# Patient Record
Sex: Female | Born: 1970 | Race: Black or African American | Hispanic: No | Marital: Single | Smoking: Never smoker
Health system: Southern US, Community
[De-identification: ages and names within clinical notes are randomized; demographics above are authoritative.]

## PROBLEM LIST (undated history)

## (undated) DIAGNOSIS — I10 Essential (primary) hypertension: Secondary | ICD-10-CM

## (undated) HISTORY — PX: ABDOMINAL HYSTERECTOMY: SHX81

## (undated) HISTORY — PX: BREAST SURGERY: SHX581

---

## 2019-07-01 ENCOUNTER — Encounter (HOSPITAL_COMMUNITY): Payer: Self-pay | Admitting: Emergency Medicine

## 2019-07-01 ENCOUNTER — Other Ambulatory Visit: Payer: Self-pay

## 2019-07-01 ENCOUNTER — Emergency Department (HOSPITAL_COMMUNITY)
Admission: EM | Admit: 2019-07-01 | Discharge: 2019-07-01 | Disposition: A | Discharge status: Home / Self Care | Payer: Self-pay | Attending: Emergency Medicine | Admitting: Emergency Medicine

## 2019-07-01 DIAGNOSIS — R519 Headache, unspecified: Secondary | ICD-10-CM | POA: Insufficient documentation

## 2019-07-01 DIAGNOSIS — Z5321 Procedure and treatment not carried out due to patient leaving prior to being seen by health care provider: Secondary | ICD-10-CM | POA: Insufficient documentation

## 2019-07-01 DIAGNOSIS — I10 Essential (primary) hypertension: Secondary | ICD-10-CM | POA: Insufficient documentation

## 2019-07-01 HISTORY — DX: Essential (primary) hypertension: I10

## 2019-07-01 NOTE — ED Triage Notes (Signed)
Pt reports since yesterday having headache and not feeling well. Reports took her BP this morning and was 179/106 ad PCP advised to go to ED. Reports Hx HTN and takes her medication as prescribed.

## 2019-07-15 ENCOUNTER — Emergency Department (HOSPITAL_COMMUNITY): Payer: Self-pay

## 2019-07-15 ENCOUNTER — Encounter (HOSPITAL_COMMUNITY): Payer: Self-pay

## 2019-07-15 ENCOUNTER — Other Ambulatory Visit: Payer: Self-pay

## 2019-07-15 ENCOUNTER — Emergency Department (HOSPITAL_COMMUNITY)
Admission: EM | Admit: 2019-07-15 | Discharge: 2019-07-15 | Disposition: A | Discharge status: Home / Self Care | Payer: Self-pay | Attending: Emergency Medicine | Admitting: Emergency Medicine

## 2019-07-15 DIAGNOSIS — Z7901 Long term (current) use of anticoagulants: Secondary | ICD-10-CM | POA: Insufficient documentation

## 2019-07-15 DIAGNOSIS — Z79899 Other long term (current) drug therapy: Secondary | ICD-10-CM | POA: Insufficient documentation

## 2019-07-15 DIAGNOSIS — R911 Solitary pulmonary nodule: Secondary | ICD-10-CM

## 2019-07-15 DIAGNOSIS — I10 Essential (primary) hypertension: Secondary | ICD-10-CM | POA: Insufficient documentation

## 2019-07-15 DIAGNOSIS — I2693 Single subsegmental pulmonary embolism without acute cor pulmonale: Secondary | ICD-10-CM | POA: Insufficient documentation

## 2019-07-15 LAB — BASIC METABOLIC PANEL
Anion gap: 10 (ref 5–15)
BUN: 14 mg/dL (ref 6–20)
CO2: 25 mmol/L (ref 22–32)
Calcium: 8.4 mg/dL — ABNORMAL LOW (ref 8.9–10.3)
Chloride: 102 mmol/L (ref 98–111)
Creatinine, Ser: 0.97 mg/dL (ref 0.44–1.00)
GFR calc Af Amer: >60 mL/min (ref >60)
GFR calc non Af Amer: >60 mL/min (ref >60)
Glucose, Bld: 94 mg/dL (ref 70–99)
Potassium: 4.5 mmol/L (ref 3.5–5.1)
Sodium: 137 mmol/L (ref 135–145)

## 2019-07-15 LAB — TROPONIN I (HIGH SENSITIVITY)
Troponin I (High Sensitivity): 2 ng/L (ref <18)
Troponin I (High Sensitivity): 3 ng/L (ref <18)

## 2019-07-15 LAB — I-STAT BETA HCG BLOOD, ED (MC, WL, AP ONLY): I-stat hCG, quantitative: <5 m[IU]/mL (ref <5)

## 2019-07-15 LAB — CBC
HCT: 38.8 % (ref 36.0–46.0)
Hemoglobin: 12.7 g/dL (ref 12.0–15.0)
MCH: 28.9 pg (ref 26.0–34.0)
MCHC: 32.7 g/dL (ref 30.0–36.0)
MCV: 88.4 fL (ref 80.0–100.0)
Platelets: 172 10*3/uL (ref 150–400)
RBC: 4.39 MIL/uL (ref 3.87–5.11)
RDW: 12.7 % (ref 11.5–15.5)
WBC: 12.1 10*3/uL — ABNORMAL HIGH (ref 4.0–10.5)
nRBC: 0 % (ref 0.0–0.2)

## 2019-07-15 LAB — D-DIMER, QUANTITATIVE: D-Dimer, Quant: 0.52 ug/mL-FEU — ABNORMAL HIGH (ref 0.00–0.50)

## 2019-07-15 IMAGING — CT CT ANGIO CHEST
2 of 6 series · 18 of 36 positions shown · IV contrast (omnipaque)
Comparison: None.

CLINICAL DATA: Left-sided chest pain radiating into the left arm
for 2 days with shortness of breath, initial encounter

EXAM:
CT ANGIOGRAPHY CHEST WITH CONTRAST
TECHNIQUE: Multidetector CT imaging of the chest was performed using the
standard protocol during bolus administration of intravenous
contrast. Multiplanar CT image reconstructions and MIPs were
obtained to evaluate the vascular anatomy.
CONTRAST:  100mL OMNIPAQUE IOHEXOL 350 MG/ML SOLN

[Series 5: thins · axial · 0.62mm/px · z∈[+1532,+1740]mm · 17 of 233 slices shown]
[im 13/233  lung]
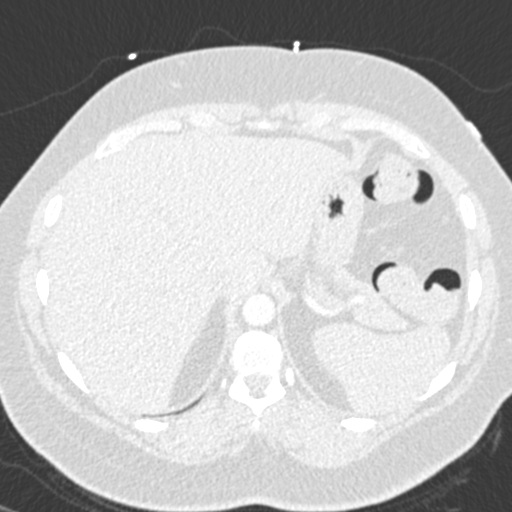
[im 26/233  mediastinal]
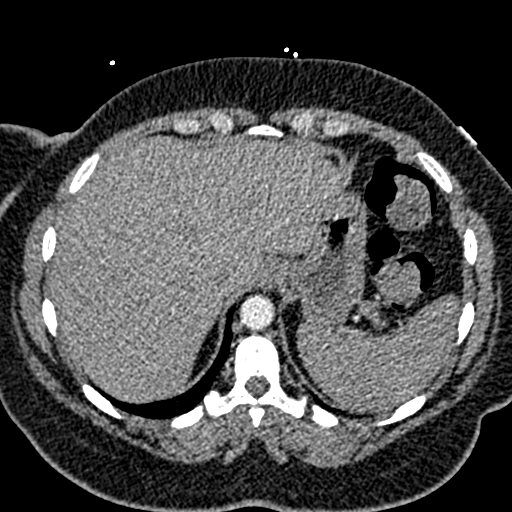
[im 39/233  lung]
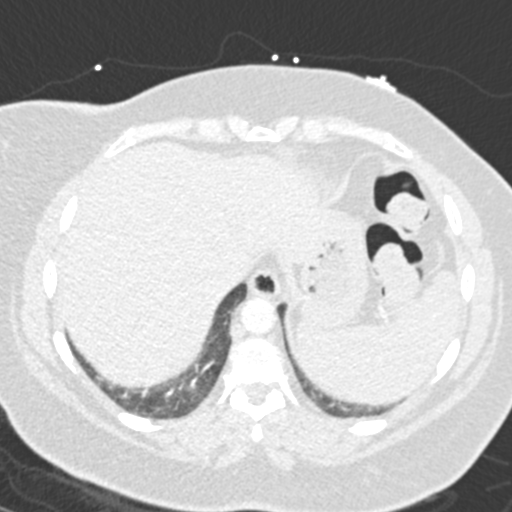
[im 52/233  mediastinal]
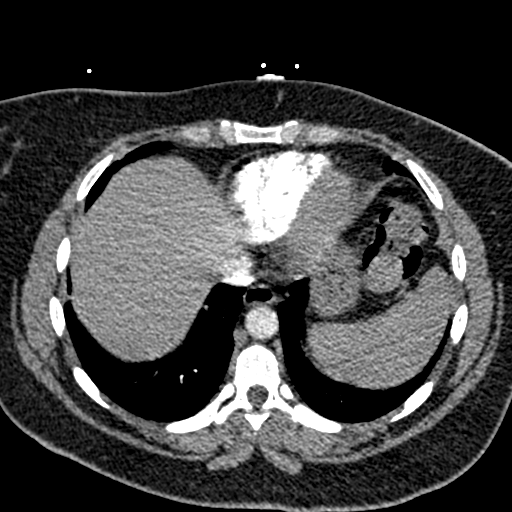
[im 65/233  lung]
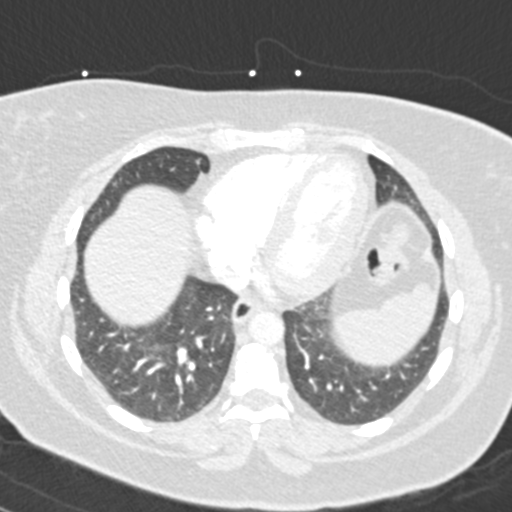
[im 78/233  mediastinal]
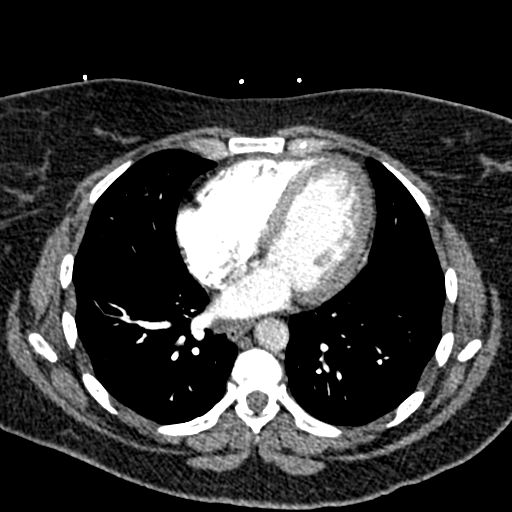
[im 91/233  lung]
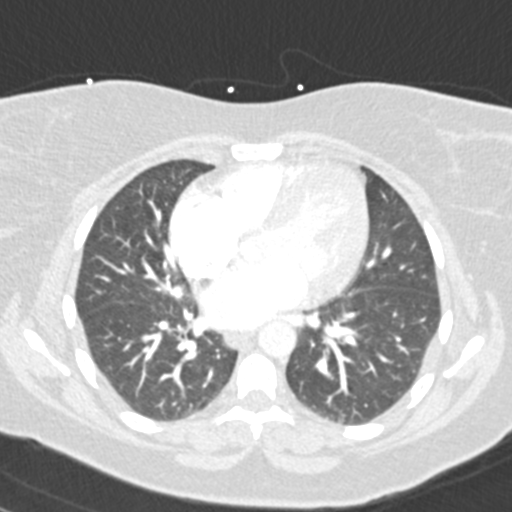
[im 104/233  mediastinal]
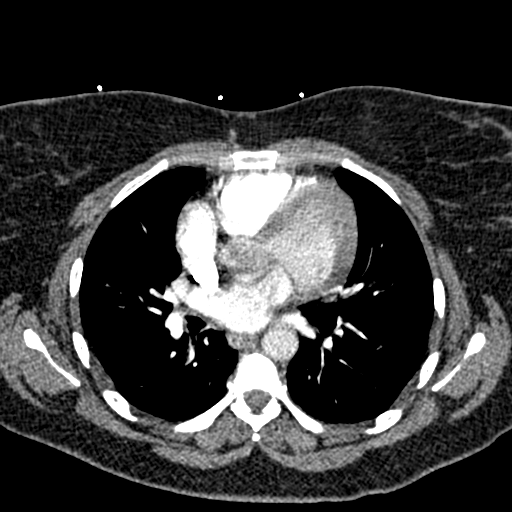
[im 117/233  lung]
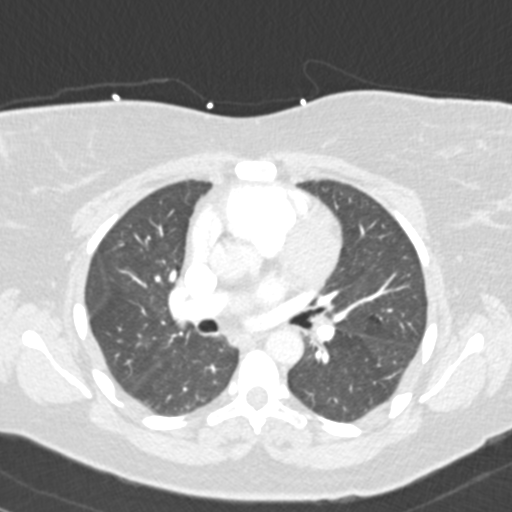
[im 129/233  mediastinal]
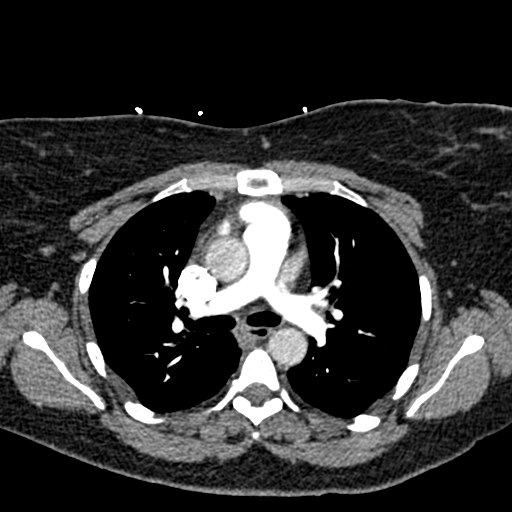
[im 142/233  lung]
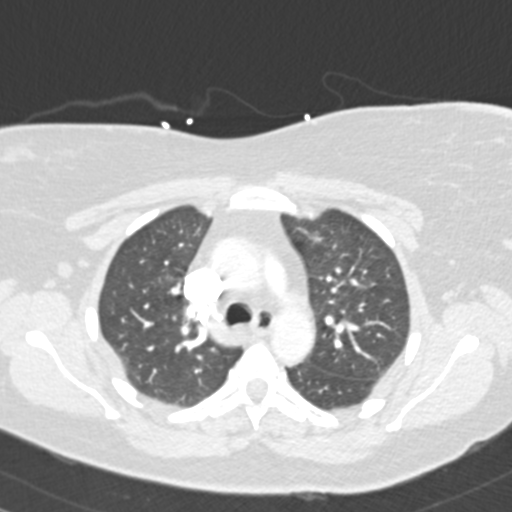
[im 155/233  mediastinal]
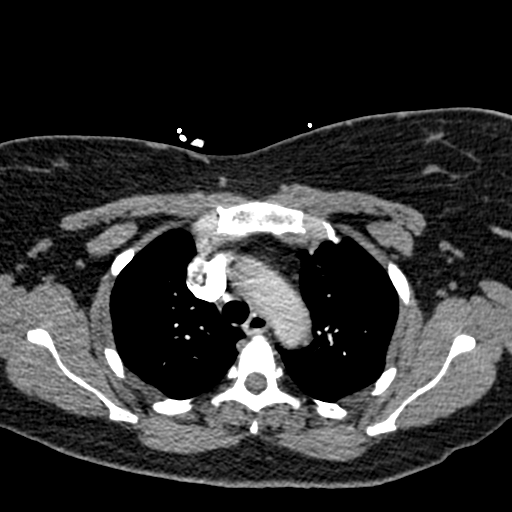
[im 168/233  lung]
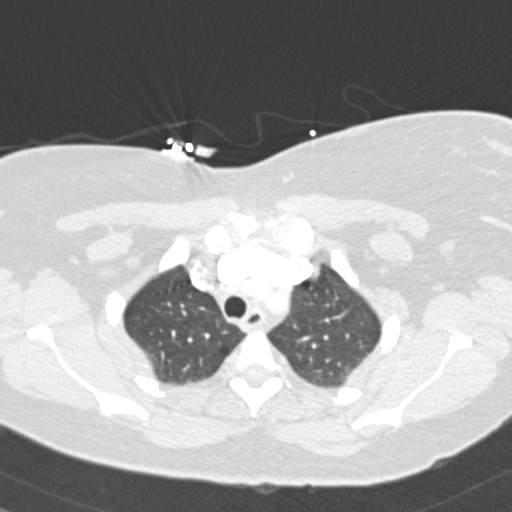
[im 181/233  mediastinal]
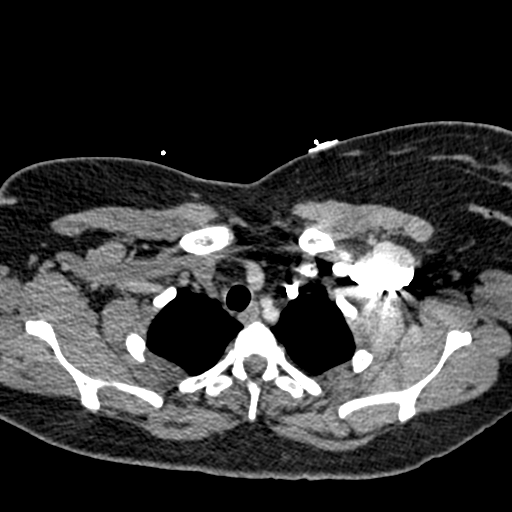
[im 194/233  lung]
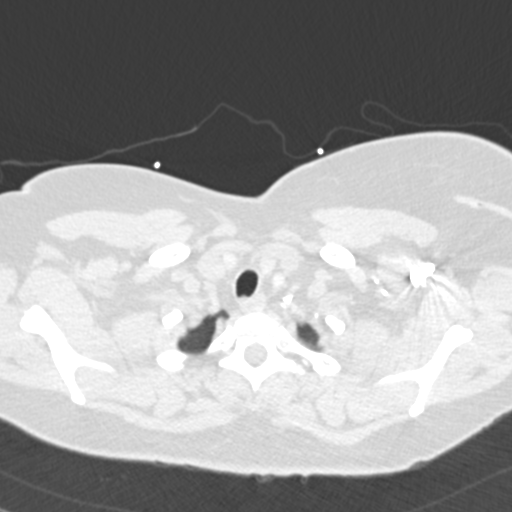
[im 207/233  mediastinal]
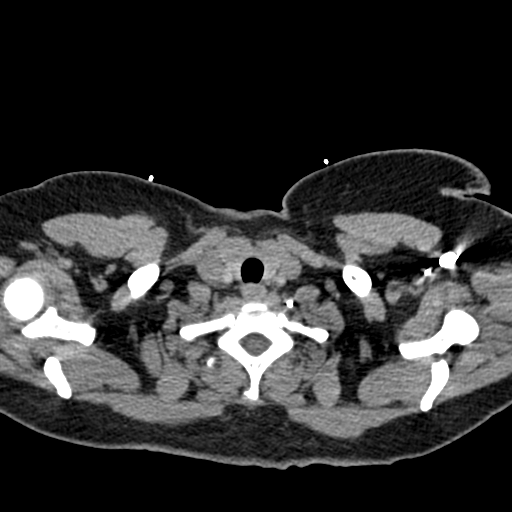
[im 220/233  lung]
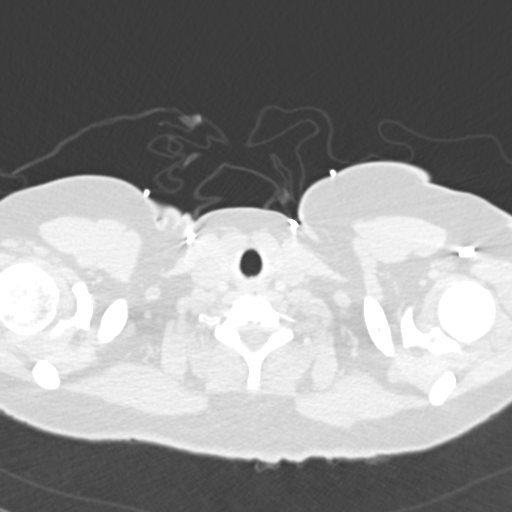

[Series 6: coronal mpr · coronal · 0.47mm/px · 1 of 138 slices shown]
[im 69/138  mediastinal]
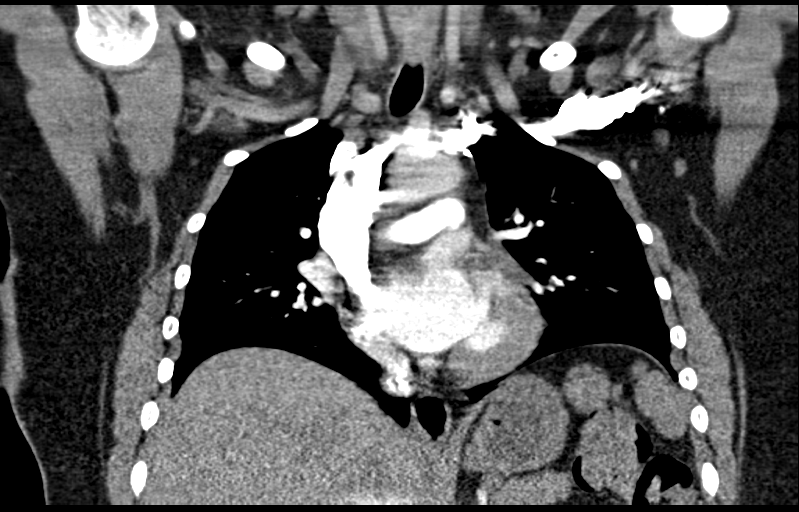

[18 of 36 positions shown; findings below may reference images not displayed]

FINDINGS: Cardiovascular: Thoracic aorta is within normal limits. No cardiac
enlargement is seen. The pulmonary artery is well visualized within
normal branching pattern. Small left lower lobe pulmonary emboli are
noted. No findings to suggest right heart strain are seen. No
significant coronary calcifications are noted.

Mediastinum/Nodes: Thoracic inlet is within normal limits. No
sizable hilar or mediastinal adenopathy is noted. The esophagus is
within normal limits.

Lungs/Pleura: Lungs are well aerated bilaterally. Small 4 mm nodule
is noted in the medial aspect of the right lower lobe. Mild
emphysematous changes are seen. No focal infiltrate or sizable
effusion is noted.

Upper Abdomen: Visualized upper abdomen shows no acute abnormality.

Musculoskeletal: No chest wall abnormality. No acute or significant
osseous findings.

Review of the MIP images confirms the above findings.
IMPRESSION: Small pulmonary emboli within the left lower lobe. No right heart
strain is noted.

Small 4 mm nodule in the medial right lower lobe. No follow-up
needed if patient is low-risk. Non-contrast chest CT can be
considered in 12 months if patient is high-risk. This recommendation
follows the consensus statement: Guidelines for Management of
Incidental Pulmonary Nodules Detected on CT Images: From the

Emphysema ([Y0]-[Y0]).

## 2019-07-15 IMAGING — CR DG CHEST 2V
2 series · 2 of 2 positions shown · non-contrast
Comparison: None.

CLINICAL DATA: Chest pain and shortness of breath two days.

EXAM:
CHEST - 2 VIEW

[w chest pa]
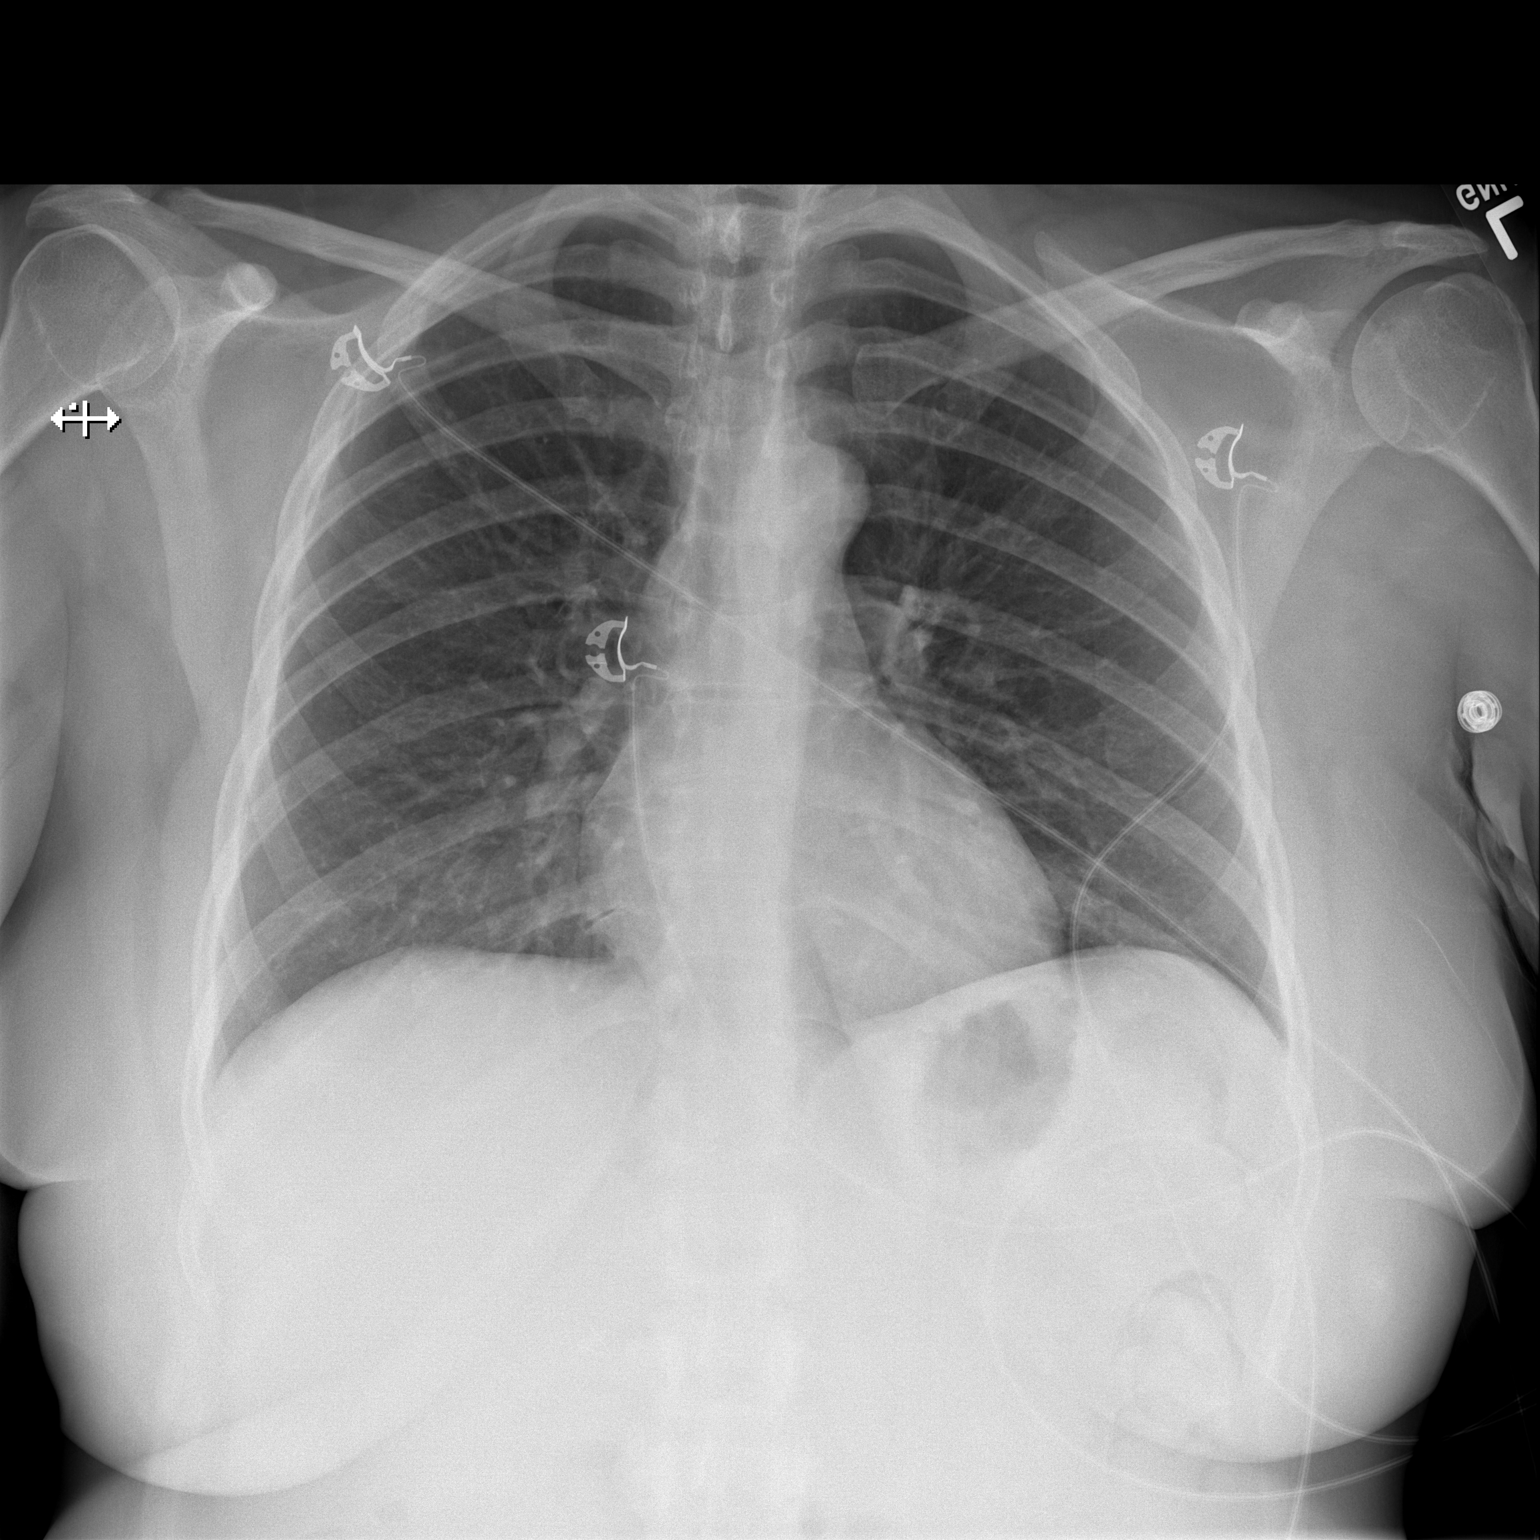

[w chest lat]
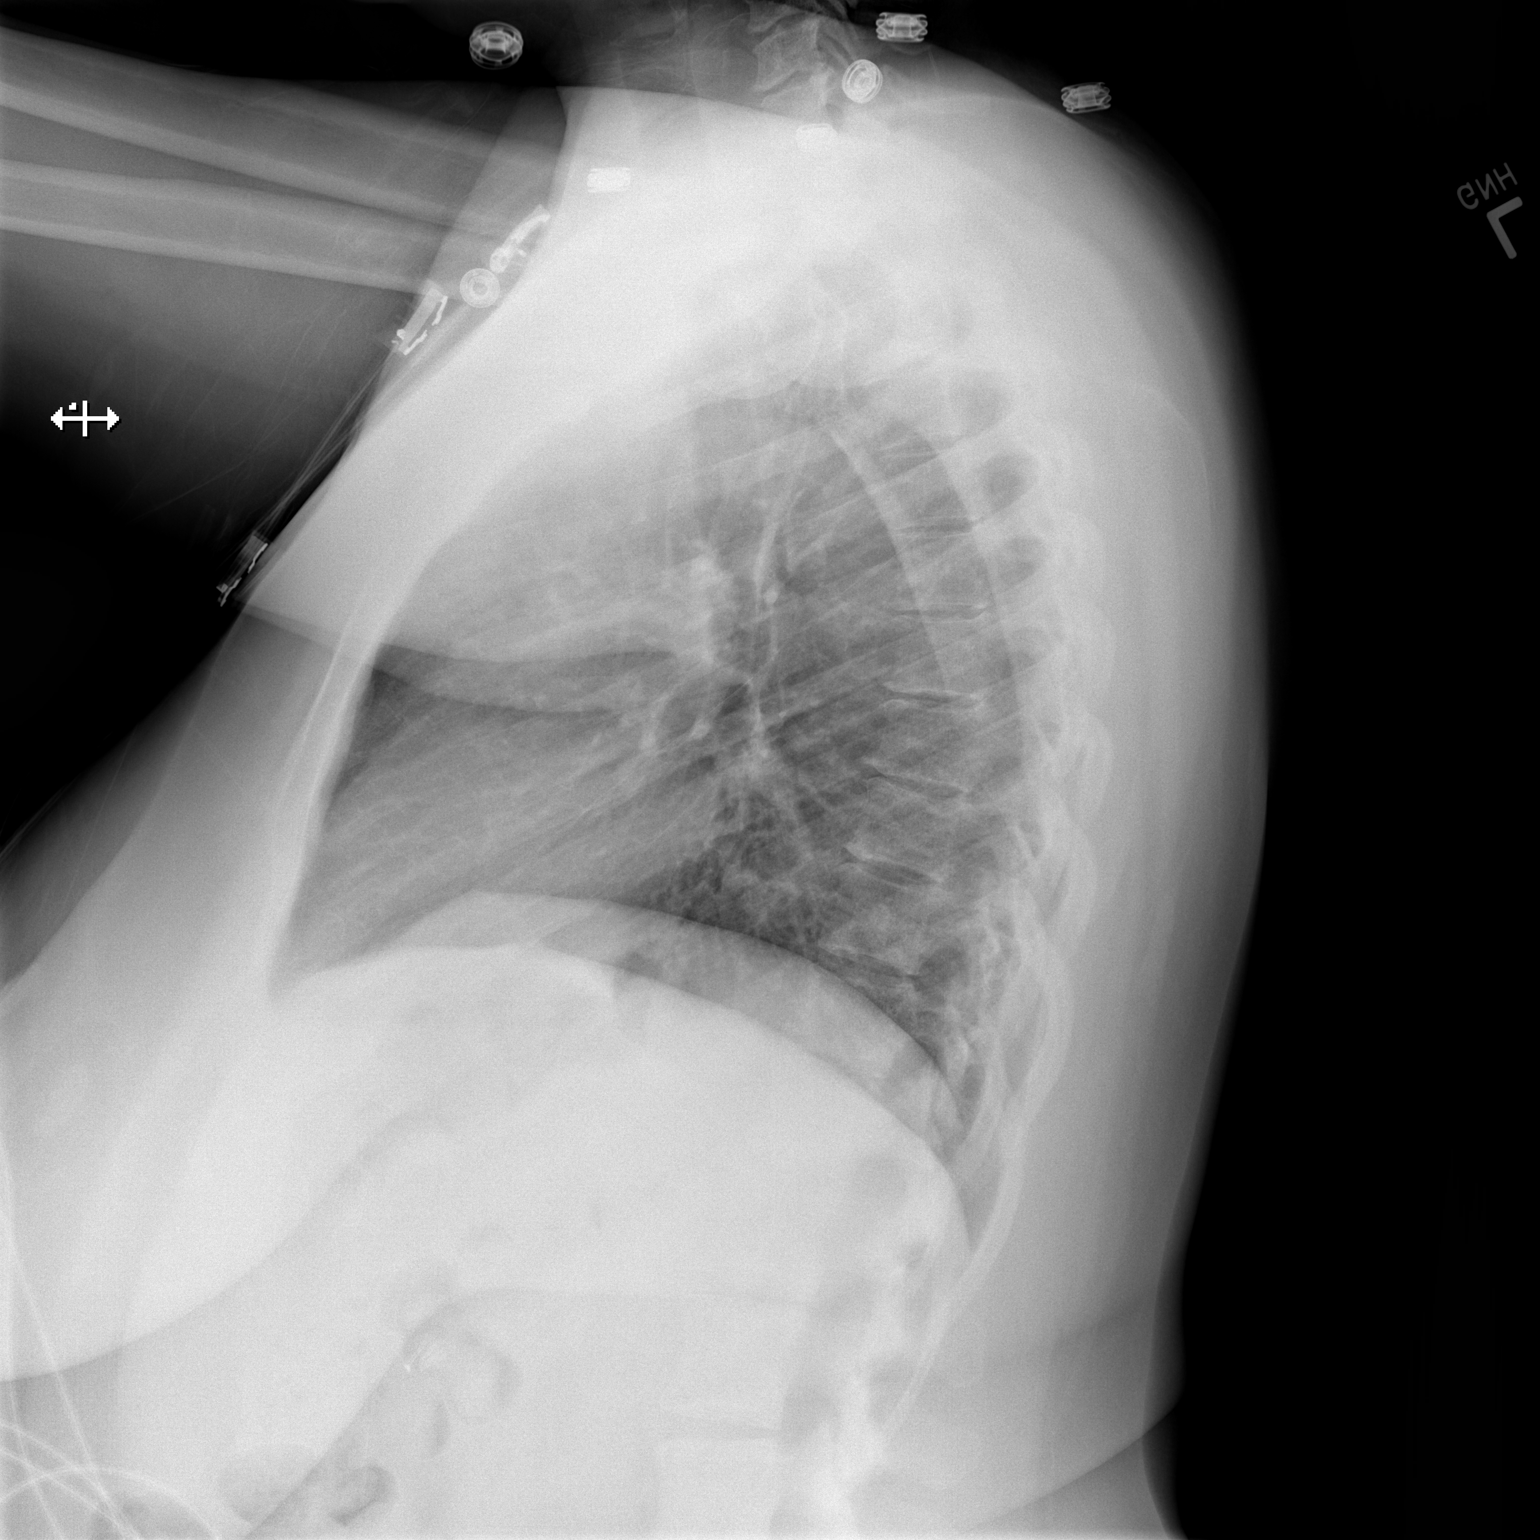

[2 of 2 positions shown; findings below may reference images not displayed]

FINDINGS: Lungs are clear. Cardiomediastinal silhouette and remainder of the
exam is unremarkable.
IMPRESSION: No active cardiopulmonary disease.

## 2019-07-15 MED ORDER — RIVAROXABAN 15 MG PO TABS: 15.0000 mg | ORAL_TABLET | Freq: Two times a day (BID) | ORAL | Status: DC

## 2019-07-15 MED ORDER — IOHEXOL 350 MG/ML SOLN
100.0000 mL | Freq: Once | INTRAVENOUS | Status: AC | PRN
  Administered 2019-07-15: 100 mL via INTRAVENOUS

## 2019-07-15 MED ORDER — NITROGLYCERIN 0.4 MG SL SUBL
0.4000 mg | SUBLINGUAL_TABLET | Freq: Once | SUBLINGUAL | Status: AC
  Administered 2019-07-15 (×2): 0.4 mg via SUBLINGUAL
  Filled 2019-07-15: qty 1

## 2019-07-15 MED ORDER — ASPIRIN 81 MG PO CHEW
324.0000 mg | CHEWABLE_TABLET | Freq: Once | ORAL | Status: AC
  Administered 2019-07-15: 324 mg via ORAL
  Filled 2019-07-15: qty 4

## 2019-07-15 MED ORDER — RIVAROXABAN 15 MG PO TABS
15.0000 mg | ORAL_TABLET | Freq: Once | ORAL | Status: AC
  Administered 2019-07-15: 15 mg via ORAL
  Filled 2019-07-15: qty 1

## 2019-07-15 MED ORDER — RIVAROXABAN 20 MG PO TABS: 20.0000 mg | ORAL_TABLET | Freq: Every day | ORAL | Status: DC

## 2019-07-15 MED ORDER — RIVAROXABAN (XARELTO) VTE STARTER PACK (15 & 20 MG)
ORAL_TABLET | ORAL | 0 refills | Status: AC
Start: 2019-07-15 — End: ?

## 2019-07-15 MED ORDER — MORPHINE SULFATE (PF) 4 MG/ML IV SOLN
4.0000 mg | Freq: Once | INTRAVENOUS | Status: AC
  Administered 2019-07-15: 4 mg via INTRAVENOUS
  Filled 2019-07-15: qty 1

## 2019-07-15 MED ORDER — FENTANYL CITRATE (PF) 100 MCG/2ML IJ SOLN
50.0000 ug | Freq: Once | INTRAMUSCULAR | Status: AC
  Administered 2019-07-15: 50 ug via INTRAVENOUS
  Filled 2019-07-15: qty 2

## 2019-07-15 MED ORDER — RIVAROXABAN (XARELTO) EDUCATION KIT FOR DVT/PE PATIENTS
PACK | Freq: Once | Status: AC
  Filled 2019-07-15: qty 1

## 2019-07-15 MED ORDER — PANTOPRAZOLE SODIUM 40 MG IV SOLR
40.0000 mg | Freq: Once | INTRAVENOUS | Status: AC
  Administered 2019-07-15: 40 mg via INTRAVENOUS
  Filled 2019-07-15: qty 40

## 2019-07-15 MED ORDER — SODIUM CHLORIDE 0.9% FLUSH
3.0000 mL | Freq: Once | INTRAVENOUS | Status: AC
  Administered 2019-07-15: 3 mL via INTRAVENOUS

## 2019-07-15 MED ORDER — SODIUM CHLORIDE 0.9 % IV BOLUS
500.0000 mL | Freq: Once | INTRAVENOUS | Status: AC
  Administered 2019-07-15: 500 mL via INTRAVENOUS

## 2019-07-15 MED ORDER — SODIUM CHLORIDE (PF) 0.9 % IJ SOLN
INTRAMUSCULAR | Status: AC
  Filled 2019-07-15: qty 50

## 2019-07-15 NOTE — Progress Notes (Addendum)
Xarelto per Pharmacy  44 yoF with new Dx PE; pharmacy consulted to dose Xarelto for ED discharge   Appropriate dose and discharge orders already entered by MD  Pharmacist provided patient education and 30-day coupon  Bernadene Person, PharmD, BCPS [phone] 07/15/2019, 6:56 PM

## 2019-07-15 NOTE — ED Notes (Signed)
Pt ambulated to restroom without assistance. Pt stable on her feet.

## 2019-07-15 NOTE — ED Notes (Signed)
Patient transported to XR at this time.

## 2019-07-15 NOTE — Discharge Instructions (Addendum)
At this time there does not appear to be the presence of an emergent medical condition, however there is always the potential for conditions to change. Please read and follow the below instructions.  1. Please return to the Emergency Department immediately for any new or worsening symptoms. 2. Please be sure to follow up with your Primary Care Provider within one week regarding your visit today; please call their office to schedule an appointment even if you are feeling better for a follow-up visit. 3. Please follow-up with your primary care doctor next week for a reassessment.  Your CT scan today showed a small pulmonary embolism in your left lower lobe.  You have been started on a blood thinner called Xarelto for treatment of this.  Xarelto which is a blood thinner puts you at high risk for bleeding so if you have any injuries such as hitting your head you must immediately return to the ER for reevaluation.  Additionally your CT scan showed a pulmonary nodule in your right lower lobe, please discuss this with your primary care doctor at your follow-up visit.  You will need a repeat CT scan of your chest for further evaluation of the pulmonary nodule in 4-months.  Get help right away if: 1. You have: ? New or increased pain, swelling, warmth, or redness in an arm or leg. ? Numbness or tingling in an arm or leg. ? Shortness of breath during activity or at rest. ? A fever. ? Chest pain. ? A rapid or irregular heartbeat. ? A severe headache. ? Vision changes. ? A serious fall or accident, or you hit your head. ? Stomach (abdominal) pain. ? Blood in your vomit, stool, or urine. ? A cut that will not stop bleeding. 2. You cough up blood. 3. You feel light-headed or dizzy. 4. You cannot move your arms or legs. 5. You are confused or have memory loss. 6. You have any new/concerning or worsening of symptoms  Please read the additional information packets attached to your discharge summary.  Do  not take your medicine if  develop an itchy rash, swelling in your mouth or lips, or difficulty breathing; call 911 and seek immediate emergency medical attention if this occurs.  Note: Portions of this text may have been transcribed using voice recognition software. Every effort was made to ensure accuracy; however, inadvertent computerized transcription errors may still be present.  Information on my medicine - XARELTO (rivaroxaban)  This medication education was reviewed with me or my healthcare representative as part of my discharge preparation.  The pharmacist that spoke with me during my hospital stay was:  Ethylene Reznick A, RPH  WHY WAS XARELTO PRESCRIBED FOR YOU? Xarelto was prescribed to treat blood clots that may have been found in the veins of your legs (deep vein thrombosis) or in your lungs (pulmonary embolism) and to reduce the risk of them occurring again.  What do you need to know about Xarelto? The starting dose is one 15 mg tablet taken TWICE daily with food for the FIRST 21 DAYS then on (enter date)  08/06/19  the dose is changed to one 20 mg tablet taken ONCE A DAY with your evening meal.  DO NOT stop taking Xarelto without talking to the health care provider who prescribed the medication.  Refill your prescription for 20 mg tablets before you run out.  After discharge, you should have regular check-up appointments with your healthcare provider that is prescribing your Xarelto.  In the future your dose  may need to be changed if your kidney function changes by a significant amount.  What do you do if you miss a dose? If you are taking Xarelto TWICE DAILY and you miss a dose, take it as soon as you remember. You may take two 15 mg tablets (total 30 mg) at the same time then resume your regularly scheduled 15 mg twice daily the next day.  If you are taking Xarelto ONCE DAILY and you miss a dose, take it as soon as you remember on the same day then continue your regularly  scheduled once daily regimen the next day. Do not take two doses of Xarelto at the same time.   Important Safety Information Xarelto is a blood thinner medicine that can cause bleeding. You should call your healthcare provider right away if you experience any of the following: ? Bleeding from an injury or your nose that does not stop. ? Unusual colored urine (red or dark brown) or unusual colored stools (red or black). ? Unusual bruising for unknown reasons. ? A serious fall or if you hit your head (even if there is no bleeding).  Some medicines may interact with Xarelto and might increase your risk of bleeding while on Xarelto. To help avoid this, consult your healthcare provider or pharmacist prior to using any new prescription or non-prescription medications, including herbals, vitamins, non-steroidal anti-inflammatory drugs (NSAIDs) and supplements.  This website has more information on Xarelto: [email or web address]

## 2019-07-15 NOTE — ED Provider Notes (Signed)
[facility] DEPT Provider Note   CSN: [id number] Arrival date & time: 07/15/19  1115     History Chief Complaint  Patient presents with  . Chest Pain    Kristina Sellers is a 49 y.o. female history of hypertension, hysterectomy.  Patient presents today for severe left chest pain and arm pain onset 2 days ago pain is constant sharp nonradiating worsened with palpation no alleviating factors.  Patient attempted Tylenol yesterday without relief of her symptoms.  She denies similar pain in the past.  Associated with shortness of breath which is only present during worsening of pain.  Patient denies fever/chills, fall/injury, sore throat, cough/hemoptysis, abdominal pain, nausea/vomiting, diarrhea, extremity swelling/color change or any additional concerns. Former smoker.  Patient reports that she received her Covid vaccine earlier this year. HPI     Past Medical History:  Diagnosis Date  . Hypertension     There are no problems to display for this patient.   Past Surgical History:  Procedure Laterality Date  . ABDOMINAL HYSTERECTOMY    . BREAST SURGERY       OB History   No obstetric history on file.     Family History  Adopted: Yes    Social History   Tobacco Use  . Smoking status: Never Smoker  . Smokeless tobacco: Never Used  Vaping Use  . Vaping Use: Never used  Substance Use Topics  . Alcohol use: Never  . Drug use: Never    Home Medications Prior to Admission medications   Medication Sig Start Date End Date Taking? Authorizing Provider  albuterol (VENTOLIN HFA) 108 (90 Base) MCG/ACT inhaler Inhale 2 puffs into the lungs every 6 (six) hours as needed for wheezing or shortness of breath.   Yes [provider]  amLODipine (NORVASC) 10 MG tablet Take 10 mg by mouth daily. 07/05/19  Yes [provider]  hydrochlorothiazide (HYDRODIURIL) 25 MG tablet Take 25 mg by mouth every morning. 07/05/19  Yes [provider]  linaclotide (LINZESS) 290 MCG CAPS capsule Take 290 mcg by mouth daily as needed (dizziness).   Yes [provider]  sodium chloride (OCEAN) 0.65 % SOLN nasal spray Place 1 spray into both nostrils daily as needed for congestion.   Yes [provider]  RIVAROXABAN Alveda Reasons) VTE STARTER PACK (15 & 20 MG TABLETS) Follow package directions: Take one 70m tablet by mouth twice a day. On day 22, switch to one 275mtablet once a day. Take with food. 07/15/19   MoDeliah BostonPA-C    Allergies    Penicillins  Review of Systems   Review of Systems Ten systems are reviewed and are negative for acute change except as noted in the HPI  Physical Exam Updated Vital Signs BP 132/84   Pulse (!) 50   Temp 98.1 F (36.7 C) (Oral)   Resp 14   Ht _0  (1.575 m)   Wt 97.1 kg   SpO2 100%   BMI 39.14 kg/m   Physical Exam Constitutional:      General: She is not in acute distress.    Appearance: Normal appearance. She is well-developed. She is not ill-appearing or diaphoretic.  HENT:     Head: Normocephalic and atraumatic.  Eyes:     General: Vision grossly intact. Gaze aligned appropriately.     Pupils: Pupils are equal, round, and reactive to light.  Neck:     Trachea: Trachea and phonation normal.  Cardiovascular:  Rate and Rhythm: Normal rate and regular rhythm.  Pulmonary:     Effort: Pulmonary effort is normal. No respiratory distress.     Breath sounds: Normal breath sounds.  Chest:     Chest wall: Tenderness present. No deformity or crepitus.  Abdominal:     General: There is no distension.     Palpations: Abdomen is soft.     Tenderness: There is no abdominal tenderness. There is no guarding or rebound.  Musculoskeletal:        General: Normal range of motion.     Cervical back: Normal range of motion.     Right lower leg: No tenderness. No edema.     Left lower leg: No tenderness. No edema.  Skin:    General: Skin is warm and dry.      Findings: No rash.  Neurological:     Mental Status: She is alert.     GCS: GCS eye subscore is 4. GCS verbal subscore is 5. GCS motor subscore is 6.     Comments: Speech is clear and goal oriented, follows commands Major Cranial nerves without deficit, no facial droop Moves extremities without ataxia, coordination intact  Psychiatric:        Behavior: Behavior normal.     ED Results / Procedures / Treatments   Labs (all labs ordered are listed, but only abnormal results are displayed) Labs Reviewed  BASIC METABOLIC PANEL - Abnormal; Notable for the following components:      Result Value   Calcium 8.4 (*)    All other components within normal limits  CBC - Abnormal; Notable for the following components:   WBC 12.1 (*)    All other components within normal limits  D-DIMER, QUANTITATIVE (NOT AT [facility]) - Abnormal; Notable for the following components:   D-Dimer, Quant 0.52 (*)    All other components within normal limits  I-STAT BETA HCG BLOOD, ED ([facility], [facility], [facility] ONLY)  TROPONIN I (HIGH SENSITIVITY)  TROPONIN I (HIGH SENSITIVITY)    EKG EKG Interpretation  Date/Time:  Friday July 15 2019 11:21:35 EDT Ventricular Rate:  75 PR Interval:    QRS Duration: 78 QT Interval:  392 QTC Calculation: 438 R Axis:   18 Text Interpretation: Sinus rhythm Abnormal R-wave progression, early transition Borderline T wave abnormalities 12 Lead; Mason-Likar Confirmed by Madalyn Rob [phone]) on 07/15/2019 12:03:11 PM   Radiology DG Chest 2 View  Result Date: 07/15/2019 CLINICAL DATA:  Chest pain and shortness of breath two days. EXAM: CHEST - 2 VIEW COMPARISON:  None. FINDINGS: Lungs are clear. Cardiomediastinal silhouette and remainder of the exam is unremarkable. IMPRESSION: No active cardiopulmonary disease. Electronically Signed   By: Marin Olp M.D.   On: 07/15/2019 14:01   CT Angio Chest PE W and/or Wo Contrast  Result Date: 07/15/2019 CLINICAL DATA:  Left-sided chest pain radiating  into the left arm for 2 days with shortness of breath, initial encounter EXAM: CT ANGIOGRAPHY CHEST WITH CONTRAST TECHNIQUE: Multidetector CT imaging of the chest was performed using the standard protocol during bolus administration of intravenous contrast. Multiplanar CT image reconstructions and MIPs were obtained to evaluate the vascular anatomy. CONTRAST:  110m OMNIPAQUE IOHEXOL 350 MG/ML SOLN COMPARISON:  None. FINDINGS: Cardiovascular: Thoracic aorta is within normal limits. No cardiac enlargement is seen. The pulmonary artery is well visualized within normal branching pattern. Small left lower lobe pulmonary emboli are noted. No findings to suggest right heart strain are seen. No significant coronary calcifications are noted.  Mediastinum/Nodes: Thoracic inlet is within normal limits. No sizable hilar or mediastinal adenopathy is noted. The esophagus is within normal limits. Lungs/Pleura: Lungs are well aerated bilaterally. Small 4 mm nodule is noted in the medial aspect of the right lower lobe. Mild emphysematous changes are seen. No focal infiltrate or sizable effusion is noted. Upper Abdomen: Visualized upper abdomen shows no acute abnormality. Musculoskeletal: No chest wall abnormality. No acute or significant osseous findings. Review of the MIP images confirms the above findings. IMPRESSION: Small pulmonary emboli within the left lower lobe. No right heart strain is noted. Small 4 mm nodule in the medial right lower lobe. No follow-up needed if patient is low-risk. Non-contrast chest CT can be considered in 12 months if patient is high-risk. This recommendation follows the consensus statement: Guidelines for Management of Incidental Pulmonary Nodules Detected on CT Images: From the Fleischner Society 2017; Radiology 2017; 284:228-243. Emphysema (ICD10-J43.9). Electronically Signed   By: Inez Catalina M.D.   On: 07/15/2019 17:28    Procedures Procedures (including critical care time)  Medications  Ordered in ED Medications  sodium chloride (PF) 0.9 % injection (has no administration in time range)  rivaroxaban (XARELTO) Education Kit for DVT/PE patients (has no administration in time range)  Rivaroxaban (XARELTO) tablet 15 mg (has no administration in time range)  sodium chloride flush (NS) 0.9 % injection 3 mL (3 mLs Intravenous Given 07/15/19 1155)  aspirin chewable tablet 324 mg (324 mg Oral Given 07/15/19 1202)  pantoprazole (PROTONIX) injection 40 mg (40 mg Intravenous Given 07/15/19 1204)  nitroGLYCERIN (NITROSTAT) SL tablet 0.4 mg (0.4 mg Sublingual Given 07/15/19 1225)  sodium chloride 0.9 % bolus 500 mL (0 mLs Intravenous Stopped 07/15/19 1413)  fentaNYL (SUBLIMAZE) injection 50 mcg (50 mcg Intravenous Given 07/15/19 1413)  iohexol (OMNIPAQUE) 350 MG/ML injection 100 mL (100 mLs Intravenous Contrast Given 07/15/19 1545)  morphine 4 MG/ML injection 4 mg (4 mg Intravenous Given 07/15/19 1648)    ED Course  I have reviewed the triage vital signs and the nursing notes.  Pertinent labs & imaging results that were available during my care of the patient were reviewed by me and considered in my medical decision making (see chart for details).    MDM Rules/Calculators/A&P                         Additional History Obtained: 1. Nursing notes from this visit. - 49 year old female with history as detailed above presents for chest pain ongoing for the past 2 days with some associated shortness of breath.  She is low risk by Wells criteria and PERC negative.  She has not taken any aspirin prior to arrival for her symptoms.  Full dose aspirin ordered.  Her pain is reproducible with palpation of the chest wall however given her history will attempt nitroglycerin to help with her symptoms along with Protonix for possible reflux etiology.  Blood work including CBC, BMP, troponin ordered along with EKG and chest x-ray. - I ordered, reviewed and interpreted labs which include: High-sensitivity  troponin within normal limits Pregnancy test negative CBC shows mild nonspecific leukocytosis of 12.1, no evidence of anemia BMP shows no emergent electrolyte derangement, evidence of acute kidney injury or gap  EKG: Sinus rhythm Abnormal R-wave progression, early transition Borderline T wave abnormalities 12 Lead; Mason-Likar Confirmed by Madalyn Rob [phone]) on 07/15/2019 12:03:11 PM  CXR:   IMPRESSION:  No active cardiopulmonary disease.  I have personally reviewed patient's chest x-ray and  agree with radiologist interpretation. - Patient seen and evaluated by Dr. Roslynn Amble, plan of care is to obtain delta troponin and D-dimer.  Pending reassuring results discharged with PCP follow-up. - Delta troponin of 3, no negation for third troponin. D-dimer minimally elevated at 0.52, CT angio ordered. - Patient was reassessed resting comfortably no acute distress reports mild return of pain, 4 mg morphine ordered.  Initial IV infiltrated, nursing staff attempted a second IV to obtain CT angios study.  Patient is agreeable to plan of care and CT scan she has no additional questions or concerns.  Vital signs remained stable. - Rediscussed case with Dr. Roslynn Amble, plan of care is discharge pending negative CT angio PE study. - CT Angio PE Study:  IMPRESSION:  Small pulmonary emboli within the left lower lobe. No right heart  strain is noted.    Small 4 mm nodule in the medial right lower lobe. No follow-up  needed if patient is low-risk. Non-contrast chest CT can be  considered in 12 months if patient is high-risk. This recommendation  follows the consensus statement: Guidelines for Management of  Incidental Pulmonary Nodules Detected on CT Images: From the  Fleischner Society 2017; Radiology 2017; 284:228-243.    Emphysema (ICD10-J43.9).  - Patient reassessed resting comfortably no acute distress vital signs stable.  No tachycardia or hypoxia on room air.  She reports she is feeling well  and would like to go home.  I updated patient on findings as above and she states understanding.  She plans to have a follow-up primary care doctor's appointment on Monday.  Patient was also informed of incidental pulmonary nodule and plans to follow-up with her PCP regarding that as well.  Shared decision making made with patient, she elects to start on Xarelto for treatment of her pulmonary embolism.  She denies any history of bleeding, she will be given education packet as well as first dose here in the ER.  She has no additional concerns or complaints.  Plan of care was discussed with Dr. Langston Masker who is attending after shift change, agrees with plan to start Xarelto and discharge.  At this time there does not appear to be any evidence of an acute emergency medical condition and the patient appears stable for discharge with appropriate outpatient follow up. Diagnosis was discussed with patient who verbalizes understanding of care plan and is agreeable to discharge. I have discussed return precautions with patient who verbalizes understanding. Patient encouraged to follow-up with their PCP. All questions answered.   Note: Portions of this report may have been transcribed using voice recognition software. Every effort was made to ensure accuracy; however, inadvertent computerized transcription errors may still be present. Final Clinical Impression(s) / ED Diagnoses Final diagnoses:  Single subsegmental pulmonary embolism without acute cor pulmonale (HCC)  Pulmonary nodule    Rx / DC Orders ED Discharge Orders         Ordered    RIVAROXABAN (XARELTO) VTE STARTER PACK (15 & 20 MG TABLETS)     Discontinue  Reprint     07/15/19 1759           Deliah Boston, PA-C 07/15/19 1803    Lucrezia Starch, MD 07/19/19 [phone]

## 2019-07-15 NOTE — ED Notes (Signed)
Pt Iv infilitrated, new iv placed, Ct called and notified

## 2019-07-15 NOTE — ED Triage Notes (Signed)
Patient c/o left chest pain that radiates into the left arm x 2 days. Patient states SOB since yesterday.

## 2019-09-15 ENCOUNTER — Ambulatory Visit (INDEPENDENT_AMBULATORY_CARE_PROVIDER_SITE_OTHER): Payer: 59 | Admitting: Emergency Medicine

## 2019-09-15 ENCOUNTER — Other Ambulatory Visit: Payer: Self-pay

## 2019-09-15 ENCOUNTER — Encounter: Payer: Self-pay | Admitting: Emergency Medicine

## 2019-09-15 DIAGNOSIS — I2693 Single subsegmental pulmonary embolism without acute cor pulmonale: Secondary | ICD-10-CM | POA: Diagnosis not present

## 2019-09-15 DIAGNOSIS — J45909 Unspecified asthma, uncomplicated: Secondary | ICD-10-CM | POA: Insufficient documentation

## 2019-09-15 DIAGNOSIS — R911 Solitary pulmonary nodule: Secondary | ICD-10-CM | POA: Diagnosis not present

## 2019-09-15 DIAGNOSIS — I2699 Other pulmonary embolism without acute cor pulmonale: Secondary | ICD-10-CM | POA: Insufficient documentation

## 2019-09-15 DIAGNOSIS — J4520 Mild intermittent asthma, uncomplicated: Secondary | ICD-10-CM | POA: Diagnosis not present

## 2019-09-15 NOTE — Addendum Note (Signed)
Addended by: Dorisann Frames R on: 09/15/2019 03:22 PM   Modules accepted: Orders

## 2019-09-15 NOTE — Patient Instructions (Signed)
Based on size of the nodule and risk factors, you should not need any further CT scans to follow your small pulmonary nodule. Agree with continuing Xarelto for at least 6 months.  At that time we can consider the pros and cons of longer therapy. After you are finished with the Xarelto you will likely need some blood work to look for any factors that increase your risk for future clots. We will arrange for pulmonary function testing Keep albuterol available to use 2 puffs when needed for shortness of breath, chest tightness, wheezing. Follow Dr. Delton Coombes with full PFT on the same day.

## 2019-09-15 NOTE — Progress Notes (Signed)
Subjective:    Patient ID: Kristina Sellers, female    DOB: 02-11-1970, 49 y.o.   MRN: [id number]  HPI Kristina Sellers is a 49 year old former smoker (0.5 pk/yrs) with a history of hypertension, asthma that was diagnosed over 10 yrs ago.  She was seen in the emergency department 07/15/2019 and was diagnosed with small left lower lobe subsegmental pulmonary emboli without any evidence of right heart strain.  She was started on DOAC therapy and discharge.  Also on that CT chest was a 4 mm right lower lobe pulmonary nodule.  She is here to discuss the pulmonary nodule and also for her pulmonary embolism, dyspnea.   She is on Xarelto managed by Dr Posey Boyer.   She has albuterol which she uses 1-2x a day, doesn't seem to help as much since the PE. Occasional wheeze, especially at night. No cough but a lot of throat clearing. She may snore. No witnessed apneas.    Review of Systems As per HPI  Past Medical History:  Diagnosis Date  . Hypertension      Family History  Adopted: Yes    Family hx unknown  Social History   Socioeconomic History  . Marital status: Single    Spouse name: Not on file  . Number of children: Not on file  . Years of education: Not on file  . Highest education level: Not on file  Occupational History  . Not on file  Tobacco Use  . Smoking status: Never Smoker  . Smokeless tobacco: Never Used  Vaping Use  . Vaping Use: Never used  Substance and Sexual Activity  . Alcohol use: Never  . Drug use: Never  . Sexual activity: Not on file  Other Topics Concern  . Not on file  Social History Narrative  . Not on file   Social Determinants of Health   Financial Resource Strain:   . Difficulty of Paying Living Expenses:   Food Insecurity:   . Worried About Programme researcher, broadcasting/film/video in the Last Year:   . Barista in the Last Year:   Transportation Needs:   . Freight forwarder (Medical):   Marland Kitchen Lack of Transportation (Non-Medical):   Physical Activity:   . Days  of Exercise per Week:   . Minutes of Exercise per Session:   Stress:   . Feeling of Stress :   Social Connections:   . Frequency of Communication with Friends and Family:   . Frequency of Social Gatherings with Friends and Family:   . Attends Religious Services:   . Active Member of Clubs or Organizations:   . Attends Banker Meetings:   Marland Kitchen Marital Status:   Intimate Partner Violence:   . Fear of Current or Ex-Partner:   . Emotionally Abused:   Marland Kitchen Physically Abused:   . 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   Allergies  Allergen Reactions  . Penicillins Other (See Comments)    Unknown  Did it involve swelling of the face/tongue/throat, SOB, or low BP? N Did it involve sudden or severe rash/hives, skin peeling, or any reaction on the inside of your mouth or nose? Y Did you need to seek medical attention at a hospital or doctor's office? N When did it last happen?childhood If all above answers are "NO", may proceed with cephalosporin use.       Outpatient Medications Prior to Visit  Medication Sig Dispense Refill  .  albuterol (VENTOLIN HFA) 108 (90 Base) MCG/ACT inhaler Inhale 2 puffs into the lungs every 6 (six) hours as needed for wheezing or shortness of breath.    Marland Kitchen amLODipine (NORVASC) 10 MG tablet Take 10 mg by mouth daily.    . hydrochlorothiazide (HYDRODIURIL) 25 MG tablet Take 25 mg by mouth every morning.    . linaclotide (LINZESS) 290 MCG CAPS capsule Take 290 mcg by mouth daily as needed (dizziness).    Marland Kitchen RIVAROXABAN (XARELTO) VTE STARTER PACK (15 & 20 MG TABLETS) Follow package directions: Take one 15mg  tablet by mouth twice a day. On day 22, switch to one 20mg  tablet once a day. Take with food. 51 each 0  . sodium chloride (OCEAN) 0.65 % SOLN nasal spray Place 1 spray into both nostrils daily as needed for congestion.     No facility-administered medications prior to visit.        Objective:   Physical  Exam Vitals:   09/15/19 1444  BP: 126/74  Pulse: (!) 58  Temp: (!) 97.3 F (36.3 C)  TempSrc: Temporal  SpO2: 98%  Weight: 220 lb (99.8 kg)  Height: 5\' 2"  (1.575 m)   Gen: Pleasant, overwt woman, in no distress,  normal affect  ENT: No lesions,  mouth clear,  oropharynx clear, no postnasal drip  Neck: No JVD, no stridor  Lungs: No use of accessory muscles, no crackles or wheezing on normal respiration, no wheeze on forced expiration  Cardiovascular: RRR, heart sounds normal, no murmur or gallops, no peripheral edema  Musculoskeletal: No deformities, no cyanosis or clubbing  Neuro: alert, awake, non focal  Skin: Warm, no lesions or rash       Assessment & Plan:  Pulmonary embolism (HCC) This is her first clot, appears to have been unprovoked.  Agree with Xarelto for 6 months and then assess for discontinuation, plan for hypercoagulability panel.  Pulmonary nodule Isolated 4 mm nodule in a patient with a minimal tobacco history.  She is likely low risk although she does not know her family history because she is adopted.  She should not need serial CTs to follow given this risk profile.  Asthma This diagnosis was made in her 30s.  She has albuterol which she uses fairly regularly ever since her PE.  We will perform pulmonary function testing to quantify her degree of obstruction, determine whether any other bronchodilators are indicated.  , MD, PhD 09/15/2019, 3:09 PM [facility] [phone] or if no answer [phone]

## 2019-09-15 NOTE — Assessment & Plan Note (Signed)
This diagnosis was made in her 30s.  She has albuterol which she uses fairly regularly ever since her PE.  We will perform pulmonary function testing to quantify her degree of obstruction, determine whether any other bronchodilators are indicated.

## 2019-09-15 NOTE — Assessment & Plan Note (Signed)
Isolated 4 mm nodule in a patient with a minimal tobacco history.  She is likely low risk although she does not know her family history because she is adopted.  She should not need serial CTs to follow given this risk profile.

## 2019-09-15 NOTE — Assessment & Plan Note (Signed)
This is her first clot, appears to have been unprovoked.  Agree with Xarelto for 6 months and then assess for discontinuation, plan for hypercoagulability panel.

## 2019-10-19 ENCOUNTER — Emergency Department (HOSPITAL_COMMUNITY): Payer: 59

## 2019-10-19 ENCOUNTER — Other Ambulatory Visit: Payer: Self-pay

## 2019-10-19 ENCOUNTER — Emergency Department (HOSPITAL_COMMUNITY)
Admission: EM | Admit: 2019-10-19 | Discharge: 2019-10-19 | Disposition: A | Discharge status: Home / Self Care | Payer: 59 | Attending: Emergency Medicine | Admitting: Emergency Medicine

## 2019-10-19 ENCOUNTER — Encounter (HOSPITAL_COMMUNITY): Payer: Self-pay | Admitting: Emergency Medicine

## 2019-10-19 DIAGNOSIS — R519 Headache, unspecified: Secondary | ICD-10-CM | POA: Insufficient documentation

## 2019-10-19 DIAGNOSIS — Z5321 Procedure and treatment not carried out due to patient leaving prior to being seen by health care provider: Secondary | ICD-10-CM | POA: Diagnosis not present

## 2019-10-19 DIAGNOSIS — R0789 Other chest pain: Secondary | ICD-10-CM | POA: Insufficient documentation

## 2019-10-19 DIAGNOSIS — R42 Dizziness and giddiness: Secondary | ICD-10-CM | POA: Insufficient documentation

## 2019-10-19 LAB — CBC
HCT: 40.3 % (ref 36.0–46.0)
Hemoglobin: 12.9 g/dL (ref 12.0–15.0)
MCH: 29.1 pg (ref 26.0–34.0)
MCHC: 32 g/dL (ref 30.0–36.0)
MCV: 90.8 fL (ref 80.0–100.0)
Platelets: 164 10*3/uL (ref 150–400)
RBC: 4.44 MIL/uL (ref 3.87–5.11)
RDW: 12.4 % (ref 11.5–15.5)
WBC: 8.2 10*3/uL (ref 4.0–10.5)
nRBC: 0 % (ref 0.0–0.2)

## 2019-10-19 LAB — BASIC METABOLIC PANEL
Anion gap: 10 (ref 5–15)
BUN: 13 mg/dL (ref 6–20)
CO2: 29 mmol/L (ref 22–32)
Calcium: 8.8 mg/dL — ABNORMAL LOW (ref 8.9–10.3)
Chloride: 100 mmol/L (ref 98–111)
Creatinine, Ser: 0.88 mg/dL (ref 0.44–1.00)
GFR calc Af Amer: >60 mL/min (ref >60)
GFR calc non Af Amer: >60 mL/min (ref >60)
Glucose, Bld: 93 mg/dL (ref 70–99)
Potassium: 3.6 mmol/L (ref 3.5–5.1)
Sodium: 139 mmol/L (ref 135–145)

## 2019-10-19 LAB — TROPONIN I (HIGH SENSITIVITY): Troponin I (High Sensitivity): 3 ng/L (ref <18)

## 2019-10-19 IMAGING — CR DG CHEST 2V
2 series · 2 of 2 positions shown · non-contrast
Comparison: [DATE].

CLINICAL DATA: Chest pain.

EXAM:
CHEST - 2 VIEW

[w chest pa]
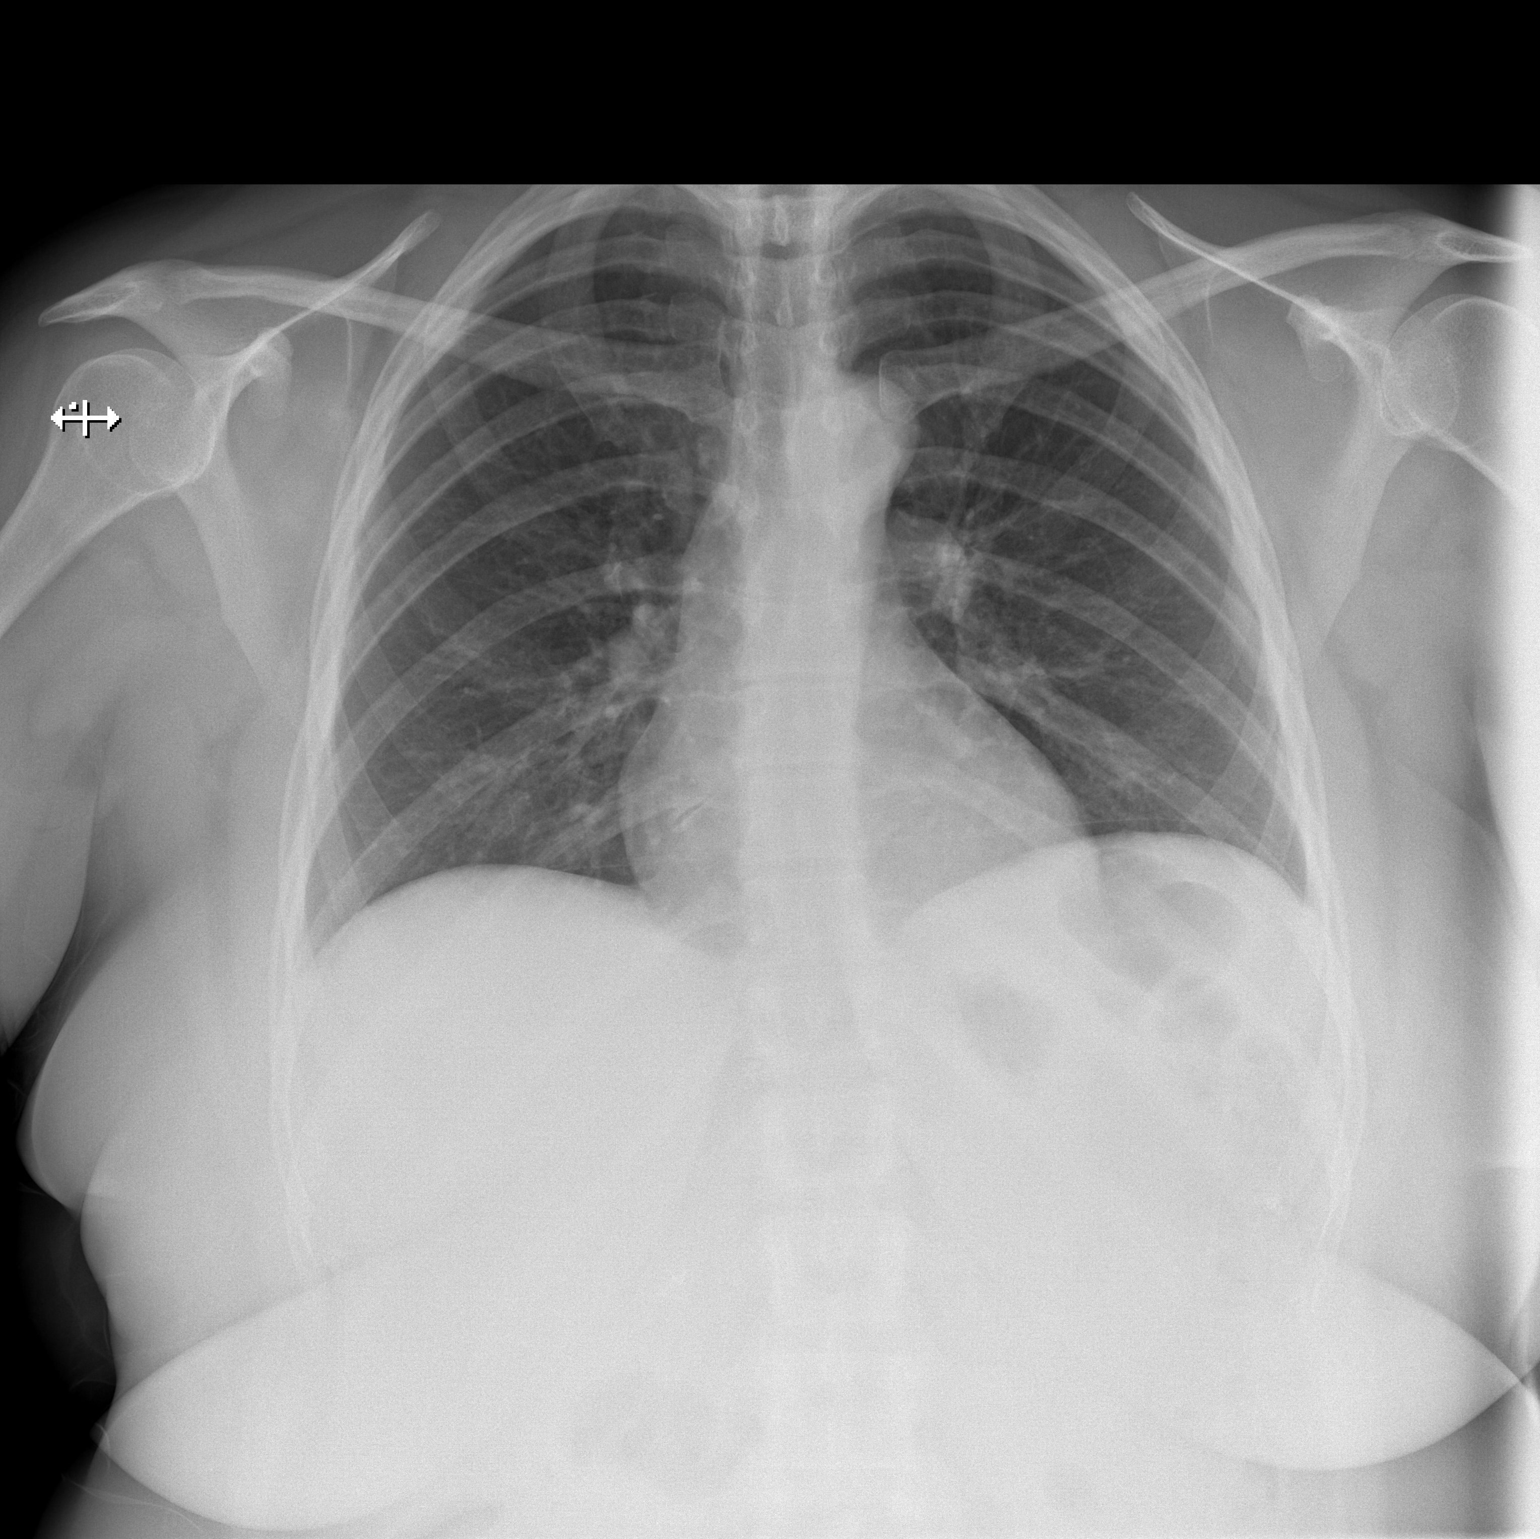

[w chest lat]
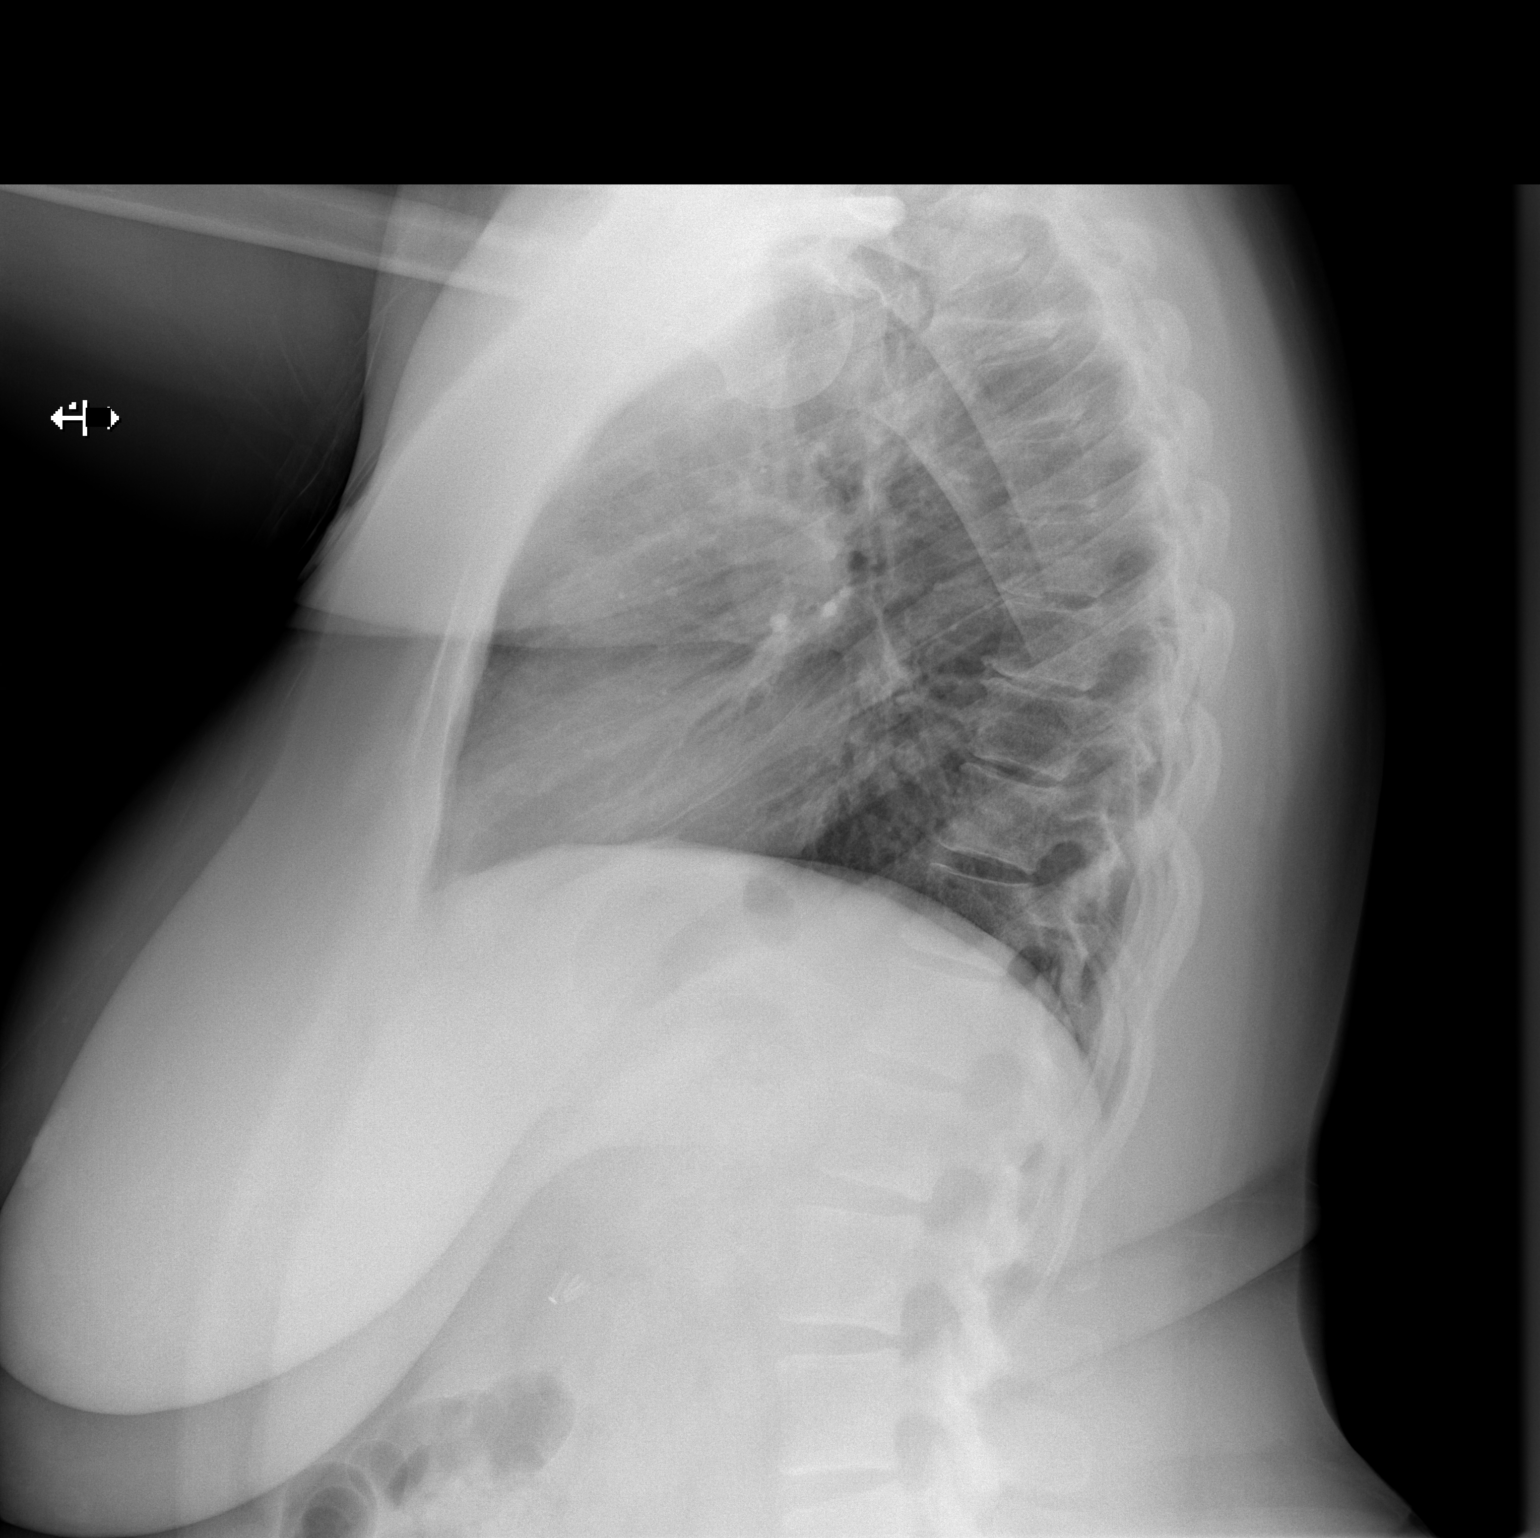

[2 of 2 positions shown; findings below may reference images not displayed]

FINDINGS: The heart size and mediastinal contours are within normal limits.
Both lungs are clear. No pneumothorax or pleural effusion is noted.
The visualized skeletal structures are unremarkable.
IMPRESSION: No active cardiopulmonary disease.

## 2019-10-19 NOTE — ED Triage Notes (Signed)
Patient is complaining of left chest pain, high blood pressure, and a headache. Patient states this started Saturday night. Patient has hx of blood clot.

## 2019-11-01 ENCOUNTER — Other Ambulatory Visit: Payer: Self-pay

## 2019-11-01 ENCOUNTER — Encounter: Payer: Self-pay | Admitting: Emergency Medicine

## 2019-11-01 ENCOUNTER — Ambulatory Visit (INDEPENDENT_AMBULATORY_CARE_PROVIDER_SITE_OTHER): Payer: 59 | Admitting: Emergency Medicine

## 2019-11-01 DIAGNOSIS — J4520 Mild intermittent asthma, uncomplicated: Secondary | ICD-10-CM

## 2019-11-01 DIAGNOSIS — I2693 Single subsegmental pulmonary embolism without acute cor pulmonale: Secondary | ICD-10-CM

## 2019-11-01 DIAGNOSIS — R911 Solitary pulmonary nodule: Secondary | ICD-10-CM | POA: Diagnosis not present

## 2019-11-01 DIAGNOSIS — R0609 Other forms of dyspnea: Secondary | ICD-10-CM | POA: Insufficient documentation

## 2019-11-01 DIAGNOSIS — R0600 Dyspnea, unspecified: Secondary | ICD-10-CM | POA: Diagnosis not present

## 2019-11-01 LAB — PULMONARY FUNCTION TEST
DL/VA % pred: 231 %
DL/VA: 10.07 ml/min/mmHg/L
DLCO cor % pred: 204 %
DLCO cor: 41.8 ml/min/mmHg
DLCO unc % pred: 109 %
DLCO unc: 22.33 ml/min/mmHg
FEF 25-75 Post: 3.11 L/sec
FEF 25-75 Pre: 2.03 L/sec
FEF2575-%Change-Post: 53 %
FEF2575-%Pred-Post: 128 %
FEF2575-%Pred-Pre: 83 %
FEV1-%Change-Post: 11 %
FEV1-%Pred-Post: 94 %
FEV1-%Pred-Pre: 84 %
FEV1-Post: 2.14 L
FEV1-Pre: 1.92 L
FEV1FVC-%Change-Post: 2 %
FEV1FVC-%Pred-Pre: 103 %
FEV6-%Change-Post: 9 %
FEV6-%Pred-Post: 90 %
FEV6-%Pred-Pre: 83 %
FEV6-Post: 2.48 L
FEV6-Pre: 2.27 L
FEV6FVC-%Change-Post: 0 %
FEV6FVC-%Pred-Post: 102 %
FEV6FVC-%Pred-Pre: 102 %
FVC-%Change-Post: 9 %
FVC-%Pred-Post: 88 %
FVC-%Pred-Pre: 81 %
FVC-Post: 2.48 L
FVC-Pre: 2.27 L
Post FEV1/FVC ratio: 86 %
Post FEV6/FVC ratio: 100 %
Pre FEV1/FVC ratio: 84 %
Pre FEV6/FVC Ratio: 100 %
RV % pred: 103 %
RV: 1.77 L
TLC % pred: 92 %
TLC: 4.55 L

## 2019-11-01 NOTE — Patient Instructions (Signed)
Please continue your Xarelto as you have been taking it. Your pulmonary function testing showed probable mild asthma.  There was not a significant amount of airflow abnormality, probably not enough to explain the shortness of breath and breathing noise that you have been experiencing.  We will evaluate for other possible contributors to shortness of breath with a cardiopulmonary exercise test.  We will work on getting this scheduled for you. Okay to keep your albuterol available to use 2 puffs if you need it for shortness of breath, chest tightness, wheezing.  Continue to keep track of whether this helps you. Follow with Dr. Delton Coombes next available after your exercise test so that we can review together.

## 2019-11-01 NOTE — Assessment & Plan Note (Signed)
Only mild obstruction noted on her pulmonary function testing.  I do not think her day-to-day exertional dyspnea and wheeze are due only to asthma.  Question some upper airway component with regard to the wheezing.  She keep albuterol available but have asked her not to use it as frequently especially since she is not getting significant clinical benefit

## 2019-11-01 NOTE — Assessment & Plan Note (Signed)
4 mm, low risk patient, likely benign.  We have not scheduled any follow-up

## 2019-11-01 NOTE — Assessment & Plan Note (Signed)
With her reassuring spirometry suspect that this is multifactorial.  Question some upper airway component given the wheezing that she is hearing.  Suspect some degree of deconditioning, weight gain.  Need to rule out a cardiac contribution.  We will perform a cardiopulmonary exercise test to review next time.

## 2019-11-01 NOTE — Addendum Note (Signed)
Addended by: Dorisann Frames R on: 11/01/2019 05:14 PM   Modules accepted: Orders

## 2019-11-01 NOTE — Progress Notes (Signed)
Subjective:    Patient ID: Kristina Sellers, female    DOB: 04-02-1970, 49 y.o.   MRN: [id number]  HPI Ms. Hackley is a 49 year old former smoker (0.5 pk/yrs) with a history of hypertension, asthma that was diagnosed over 10 yrs ago.  She was seen in the emergency department 07/15/2019 and was diagnosed with small left lower lobe subsegmental pulmonary emboli without any evidence of right heart strain.  She was started on DOAC therapy and discharge.  Also on that CT chest was a 4 mm right lower lobe pulmonary nodule.  She is here to discuss the pulmonary nodule and also for her pulmonary embolism, dyspnea.   She is on Xarelto managed by Dr Posey Boyer.   She has albuterol which she uses 1-2x a day, doesn't seem to help as much since the PE. Occasional wheeze, especially at night. No cough but a lot of throat clearing. She may snore. No witnessed apneas.   ROC 11/01/19 --49 year old former smoker (minimal exposure) with hypertension and asthma.  She was diagnosed with a left lower lobe pulmonary embolism 07/15/2019 by CT scan.  A 4 mm right lower lobe pulmonary nodule was also seen which is likely benign and should not need follow-up.  She has had continued shortness of breath, and increase in wheezing ever since the PE was diagnosed.  She returns today reporting that she is still dyspneic, still hears wheeze when she is active. She was awakened in the night last night, heard some wheeze.  She has albuterol available which she is using often, but she isn;t getting much relief - sometimes 5x a day. Minimal cough.   Pulmonary function testing done today reviewed by me, shows grossly normal airflows but apparent obstruction based on curve of her flow volume loop as well as a borderline positive bronchodilator response.  Lung volumes and diffusion capacity were normal.   Review of Systems As per HPI  Past Medical History:  Diagnosis Date   Hypertension      Family History  Adopted: Yes    Family hx  unknown  Social History   Socioeconomic History   Marital status: Single    Spouse name: Not on file   Number of children: Not on file   Years of education: Not on file   Highest education level: Not on file  Occupational History   Not on file  Tobacco Use   Smoking status: Never Smoker   Smokeless tobacco: Never Used  Vaping Use   Vaping Use: Never used  Substance and Sexual Activity   Alcohol use: Never   Drug use: Never   Sexual activity: Not on file  Other Topics Concern   Not on file  Social History Narrative   Not on file   Social Determinants of Health   Financial Resource Strain:    Difficulty of Paying Living Expenses: Not on file  Food Insecurity:    Worried About Running Out of Food in the Last Year: Not on file   Ran Out of Food in the Last Year: Not on file  Transportation Needs:    Lack of Transportation (Medical): Not on file   Lack of Transportation (Non-Medical): Not on file  Physical Activity:    Days of Exercise per Week: Not on file   Minutes of Exercise per Session: Not on file  Stress:    Feeling of Stress : Not on file  Social Connections:    Frequency of Communication with Friends and Family: Not on file  Frequency of Social Gatherings with Friends and Family: Not on file   Attends Religious Services: Not on file   Active Member of Clubs or Organizations: Not on file   Attends Banker Meetings: Not on file   Marital Status: Not on file  Intimate Partner Violence:    Fear of Current or Ex-Partner: Not on file   Emotionally Abused: Not on file   Physically Abused: Not on file   Sexually Abused: Not on file    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   Allergies  Allergen Reactions   Other Itching    Apples,peaches,plums causes swelling   Penicillins Other (See Comments)    Unknown  Did it involve swelling of the face/tongue/throat, SOB, or low BP? N Did it involve sudden  or severe rash/hives, skin peeling, or any reaction on the inside of your mouth or nose? Y Did you need to seek medical attention at a hospital or doctor's office? N When did it last happen?childhood If all above answers are "NO", may proceed with cephalosporin use.       Outpatient Medications Prior to Visit  Medication Sig Dispense Refill   albuterol (VENTOLIN HFA) 108 (90 Base) MCG/ACT inhaler Inhale 2 puffs into the lungs every 6 (six) hours as needed for wheezing or shortness of breath.     amLODipine (NORVASC) 10 MG tablet Take 10 mg by mouth daily.     hydrochlorothiazide (HYDRODIURIL) 25 MG tablet Take 25 mg by mouth every morning.     linaclotide (LINZESS) 290 MCG CAPS capsule Take 290 mcg by mouth daily as needed (dizziness).     RIVAROXABAN (XARELTO) VTE STARTER PACK (15 & 20 MG TABLETS) Follow package directions: Take one 15mg  tablet by mouth twice a day. On day 22, switch to one 20mg  tablet once a day. Take with food. 51 each 0   sodium chloride (OCEAN) 0.65 % SOLN nasal spray Place 1 spray into both nostrils daily as needed for congestion.     No facility-administered medications prior to visit.        Objective:   Physical Exam Vitals:   11/01/19 1609  BP: (!) 142/78  Pulse: 70  Temp: 97.6 F (36.4 C)  TempSrc: Temporal  SpO2: 100%  Weight: 223 lb (101.2 kg)  Height: 5\' 3"  (1.6 m)   Gen: Pleasant, overwt woman, in no distress,  normal affect  ENT: No lesions,  mouth clear,  oropharynx clear, no postnasal drip  Neck: No JVD, no stridor  Lungs: No use of accessory muscles, no crackles or wheezing on normal respiration, no wheeze on forced expiration  Cardiovascular: RRR, heart sounds normal, no murmur or gallops, no peripheral edema  Musculoskeletal: No deformities, no cyanosis or clubbing  Neuro: alert, awake, non focal  Skin: Warm, no lesions or rash       Assessment & Plan:  Pulmonary embolism (HCC) Diagnosed in June.  Planning for  at least 6 months of therapy.  Currently on Xarelto and tolerating  Asthma Only mild obstruction noted on her pulmonary function testing.  I do not think her day-to-day exertional dyspnea and wheeze are due only to asthma.  Question some upper airway component with regard to the wheezing.  She keep albuterol available but have asked her not to use it as frequently especially since she is not getting significant clinical benefit  Pulmonary nodule 4 mm, low risk patient, likely benign.  We have not scheduled any follow-up  Dyspnea on  exertion With her reassuring spirometry suspect that this is multifactorial.  Question some upper airway component given the wheezing that she is hearing.  Suspect some degree of deconditioning, weight gain.  Need to rule out a cardiac contribution.  We will perform a cardiopulmonary exercise test to review next time.  Levy Pupa, MD, PhD 11/01/2019, 4:31 PM [facility] [phone] or if no answer [phone]

## 2019-11-01 NOTE — Progress Notes (Signed)
Full PFT performed today. °

## 2019-11-01 NOTE — Assessment & Plan Note (Signed)
Diagnosed in June.  Planning for at least 6 months of therapy.  Currently on Xarelto and tolerating

## 2019-12-05 ENCOUNTER — Encounter (HOSPITAL_COMMUNITY): Payer: 59

## 2020-01-07 ENCOUNTER — Other Ambulatory Visit: Payer: Self-pay

## 2020-01-07 ENCOUNTER — Emergency Department (HOSPITAL_COMMUNITY): Payer: 59

## 2020-01-07 ENCOUNTER — Encounter (HOSPITAL_COMMUNITY): Payer: Self-pay | Admitting: *Deleted

## 2020-01-07 ENCOUNTER — Emergency Department (HOSPITAL_COMMUNITY)
Admission: EM | Admit: 2020-01-07 | Discharge: 2020-01-07 | Disposition: A | Discharge status: Home / Self Care | Payer: 59 | Attending: Emergency Medicine | Admitting: Emergency Medicine

## 2020-01-07 DIAGNOSIS — J45909 Unspecified asthma, uncomplicated: Secondary | ICD-10-CM | POA: Insufficient documentation

## 2020-01-07 DIAGNOSIS — J069 Acute upper respiratory infection, unspecified: Secondary | ICD-10-CM | POA: Diagnosis not present

## 2020-01-07 DIAGNOSIS — Z86711 Personal history of pulmonary embolism: Secondary | ICD-10-CM | POA: Insufficient documentation

## 2020-01-07 DIAGNOSIS — H9203 Otalgia, bilateral: Secondary | ICD-10-CM | POA: Insufficient documentation

## 2020-01-07 DIAGNOSIS — Z7901 Long term (current) use of anticoagulants: Secondary | ICD-10-CM | POA: Diagnosis not present

## 2020-01-07 DIAGNOSIS — Z7951 Long term (current) use of inhaled steroids: Secondary | ICD-10-CM | POA: Diagnosis not present

## 2020-01-07 DIAGNOSIS — I10 Essential (primary) hypertension: Secondary | ICD-10-CM | POA: Insufficient documentation

## 2020-01-07 DIAGNOSIS — Z79899 Other long term (current) drug therapy: Secondary | ICD-10-CM | POA: Insufficient documentation

## 2020-01-07 DIAGNOSIS — R059 Cough, unspecified: Secondary | ICD-10-CM | POA: Diagnosis present

## 2020-01-07 DIAGNOSIS — R0981 Nasal congestion: Secondary | ICD-10-CM

## 2020-01-07 IMAGING — CR DG CHEST 2V
2 series · 2 of 2 positions shown · non-contrast
Comparison: Chest x-ray dated [DATE]

CLINICAL DATA: Cough, body aches, chills, sore throat.

EXAM:
CHEST - 2 VIEW

[w chest pa]
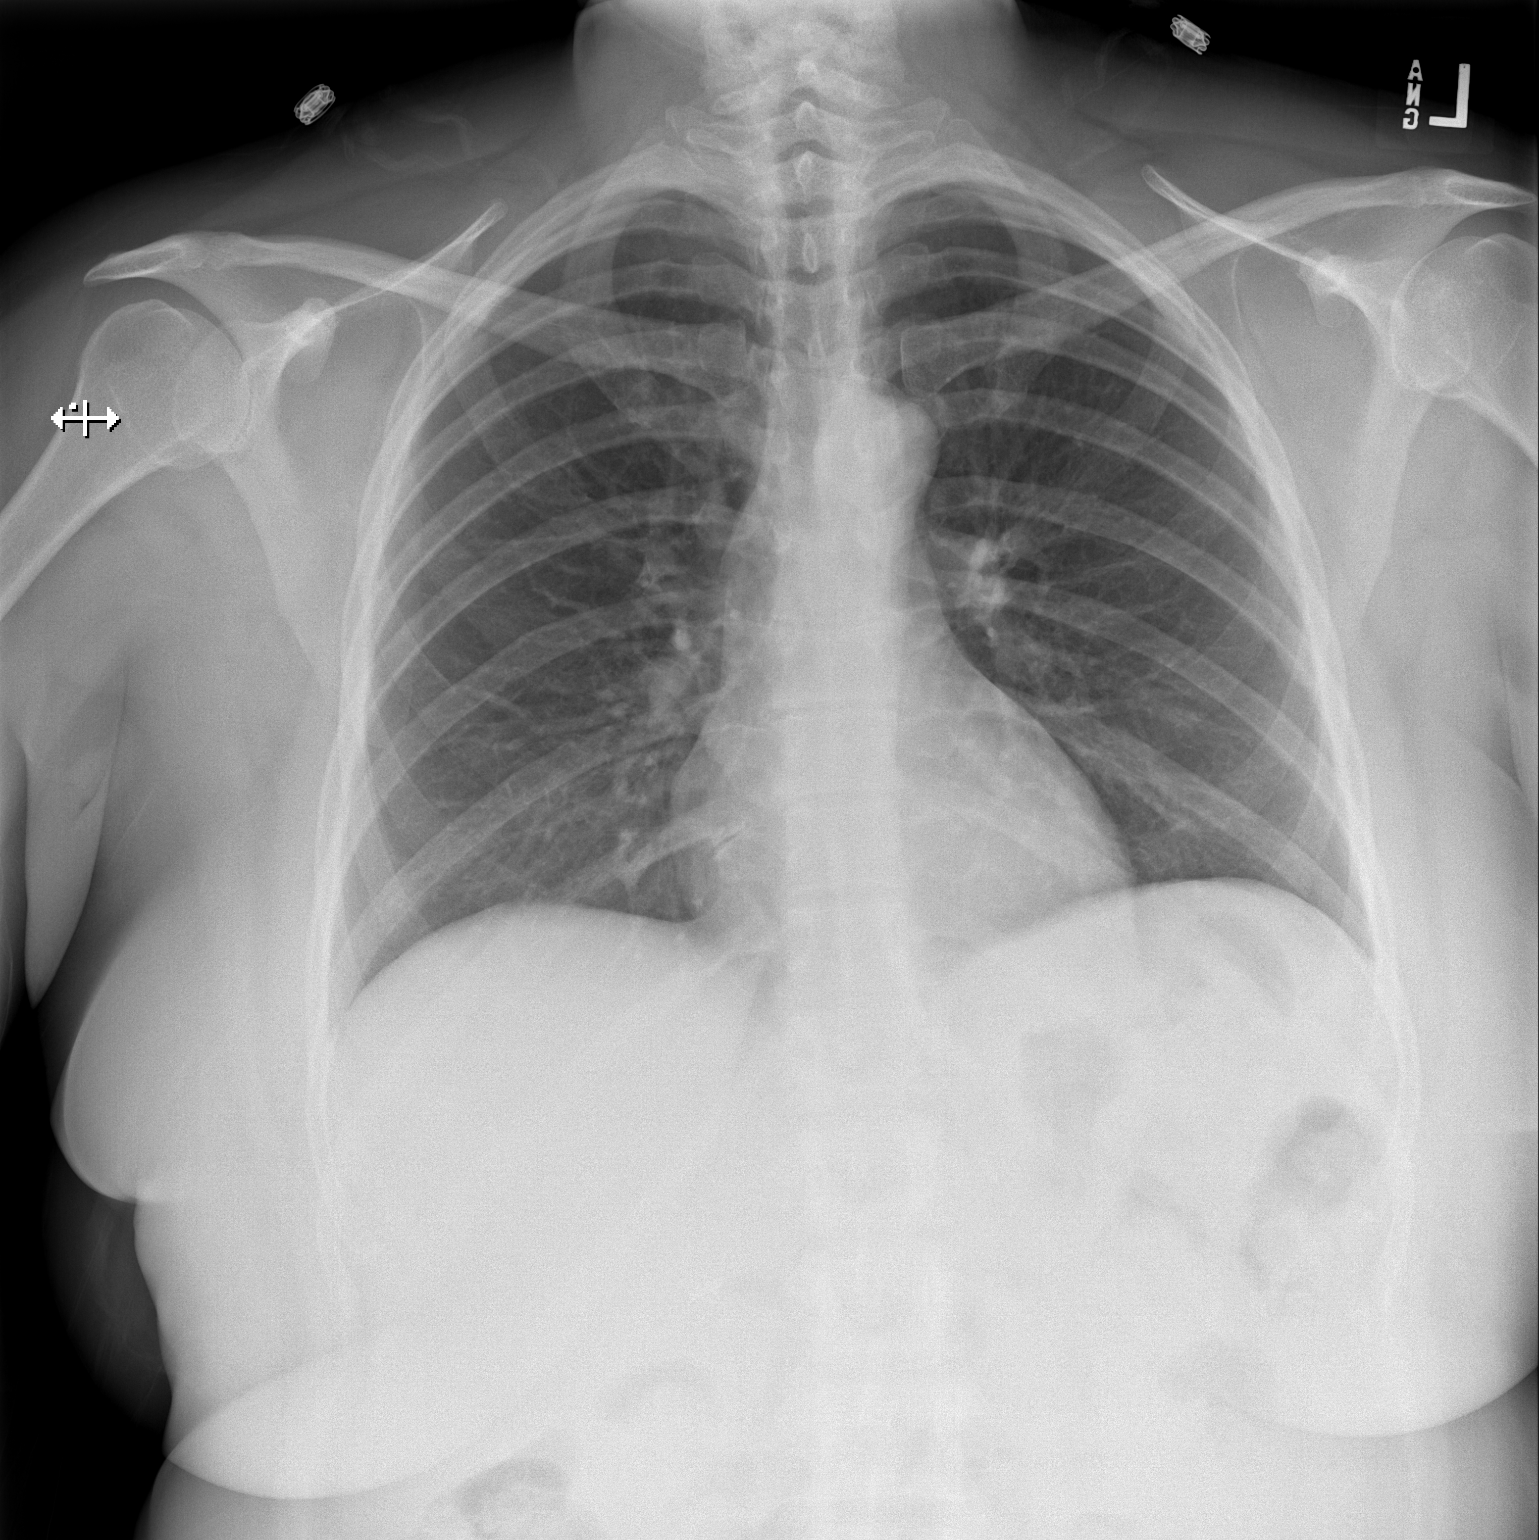

[w chest lat]
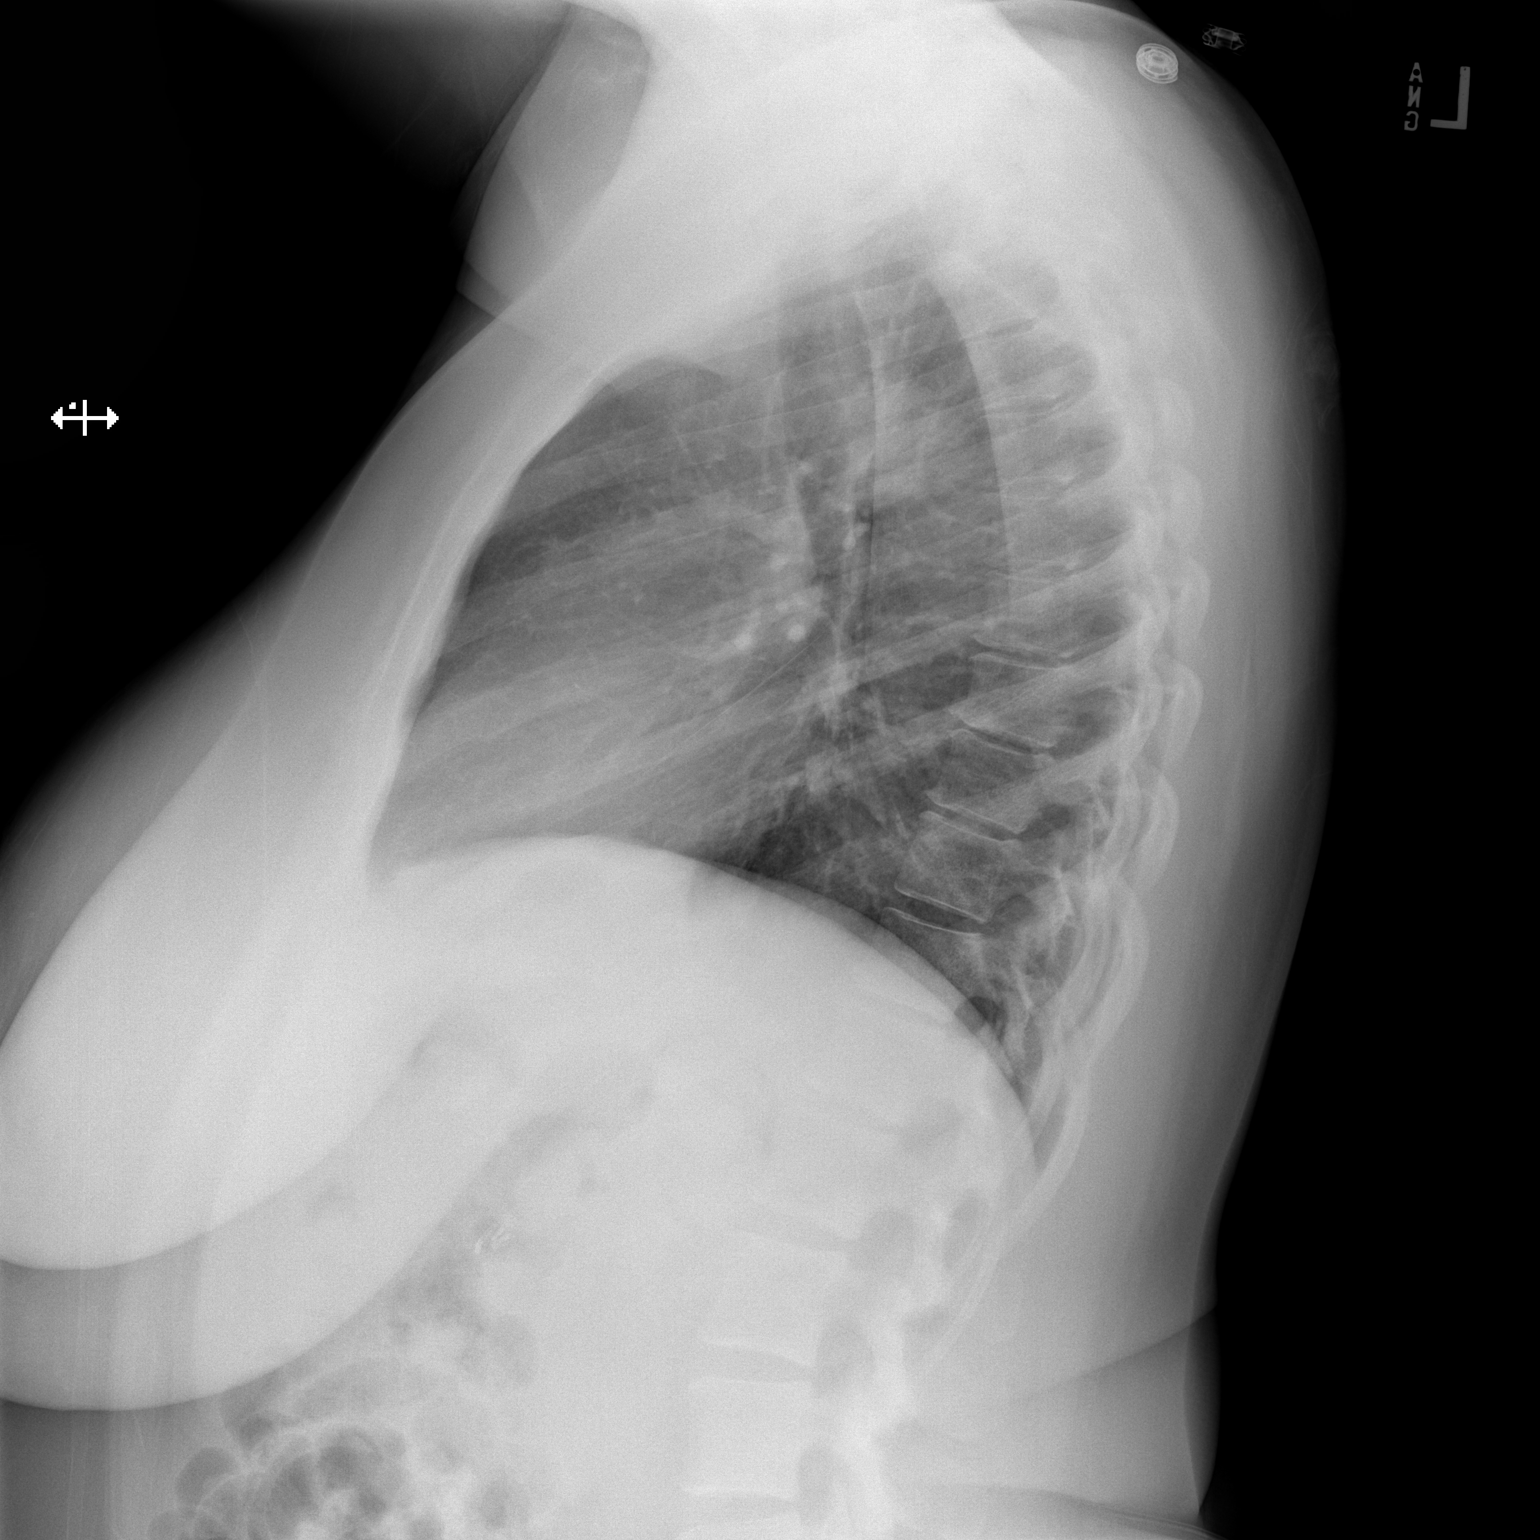

[2 of 2 positions shown; findings below may reference images not displayed]

FINDINGS: Heart size and mediastinal contours are normal. Lungs are clear. No
pleural effusion or pneumothorax is seen. Osseous structures about
the chest are unremarkable.
IMPRESSION: Normal chest x-ray.  No evidence of pneumonia.

## 2020-01-07 MED ORDER — BENZONATATE 100 MG PO CAPS: 100.0000 mg | ORAL_CAPSULE | Freq: Three times a day (TID) | ORAL | 0 refills | Status: DC

## 2020-01-07 MED ORDER — FLUTICASONE PROPIONATE 50 MCG/ACT NA SUSP: 1.0000 | Freq: Every day | NASAL | 2 refills | Status: AC

## 2020-01-07 NOTE — ED Provider Notes (Signed)
[facility]-EMERGENCY DEPT Provider Note   CSN: [id number] Arrival date & time: 01/07/20  3267     History Chief Complaint  Patient presents with  . Otalgia  . Sore Throat  . Cough  . Generalized Body Aches    Kristina Sellers is a 49 y.o. female with a past medical history of hypertension presenting to the ED for 2-day history of bilateral ear pain, sore throat, generalized body aches, dry cough.  No known Covid exposures but she does work at a daycare.  She tried Alka-Seltzer last night with only minimal improvement in her symptoms.  Reports sinus congestion.  No shortness of breath, chest pain, abdominal pain, vomiting.  She has been vaccinated against Covid and flu.  HPI     Past Medical History:  Diagnosis Date  . Hypertension     Patient Active Problem List   Diagnosis Date Noted  . Dyspnea on exertion 11/01/2019  . Pulmonary embolism (HCC) 09/15/2019  . Pulmonary nodule 09/15/2019  . Asthma 09/15/2019    Past Surgical History:  Procedure Laterality Date  . ABDOMINAL HYSTERECTOMY    . BREAST SURGERY       OB History   No obstetric history on file.     Family History  Adopted: Yes    Social History   Tobacco Use  . Smoking status: Never Smoker  . Smokeless tobacco: Never Used  Vaping Use  . Vaping Use: Never used  Substance Use Topics  . Alcohol use: Never  . Drug use: Never    Home Medications Prior to Admission medications   Medication Sig Start Date End Date Taking? Authorizing Provider  albuterol (VENTOLIN HFA) 108 (90 Base) MCG/ACT inhaler Inhale 2 puffs into the lungs every 6 (six) hours as needed for wheezing or shortness of breath.    [provider]  amLODipine (NORVASC) 10 MG tablet Take 10 mg by mouth daily. 07/05/19   [provider]  benzonatate (TESSALON) 100 MG capsule Take 1 capsule (100 mg total) by mouth every 8 (eight) hours. 01/07/20   Amorette Charrette, PA-C  fluticasone (FLONASE) 50 MCG/ACT nasal  spray Place 1 spray into both nostrils daily. 01/07/20   Orris Perin, PA-C  hydrochlorothiazide (HYDRODIURIL) 25 MG tablet Take 25 mg by mouth every morning. 07/05/19   [provider]  linaclotide (LINZESS) 290 MCG CAPS capsule Take 290 mcg by mouth daily as needed (dizziness).    [provider]  RIVAROXABAN Carlena Hurl) VTE STARTER PACK (15 & 20 MG TABLETS) Follow package directions: Take one 15mg  tablet by mouth twice a day. On day 22, switch to one 20mg  tablet once a day. Take with food. 07/15/19   A, PA-C  sodium chloride (OCEAN) 0.65 % SOLN nasal spray Place 1 spray into both nostrils daily as needed for congestion.    [provider]    Allergies    Other and Penicillins  Review of Systems   Review of Systems  Constitutional: Positive for chills. Negative for appetite change and fever.  HENT: Positive for ear pain, sinus pressure and sore throat. Negative for rhinorrhea and sneezing.   Eyes: Negative for photophobia and visual disturbance.  Respiratory: Positive for cough. Negative for chest tightness, shortness of breath and wheezing.   Cardiovascular: Negative for chest pain and palpitations.  Gastrointestinal: Negative for abdominal pain, blood in stool, constipation, diarrhea, nausea and vomiting.  Genitourinary: Negative for dysuria, hematuria and urgency.  Musculoskeletal: Negative for myalgias.  Skin: Negative for  rash.  Neurological: Negative for dizziness, weakness and light-headedness.    Physical Exam Updated Vital Signs BP (!) 151/94 (BP Location: Right Arm)   Pulse 84   Temp 99.4 F (37.4 C) (Oral)   Resp 16   Ht 5\' 2"  (1.575 m)   Wt 99.8 kg   SpO2 96%   BMI 40.24 kg/m   Physical Exam Vitals and nursing note reviewed.  Constitutional:      General: She is not in acute distress.    Appearance: She is well-developed.     Comments: Speaking complete sentences without difficulty.  No signs of respiratory distress.    HENT:     Head: Normocephalic and atraumatic.     Right Ear: A middle ear effusion is present.     Left Ear: A middle ear effusion is present.     Nose: Nose normal.     Mouth/Throat:     Tonsils: 0 on the right. 0 on the left.     Comments: Patient does not appear to be in acute distress. No trismus or drooling present. No pooling of secretions. Patient is tolerating secretions and is not in respiratory distress. No neck pain or tenderness to palpation of the neck. Full active and passive range of motion of the neck. No evidence of RPA or PTA. Eyes:     General: No scleral icterus.       Left eye: No discharge.     Conjunctiva/sclera: Conjunctivae normal.  Cardiovascular:     Rate and Rhythm: Normal rate and regular rhythm.     Heart sounds: Normal heart sounds. No murmur heard.  No friction rub. No gallop.   Pulmonary:     Effort: Pulmonary effort is normal. No respiratory distress.     Breath sounds: Normal breath sounds.  Abdominal:     General: Bowel sounds are normal. There is no distension.     Palpations: Abdomen is soft.     Tenderness: There is no abdominal tenderness. There is no guarding.  Musculoskeletal:        General: Normal range of motion.     Cervical back: Normal range of motion and neck supple.  Skin:    General: Skin is warm and dry.     Findings: No rash.  Neurological:     Mental Status: She is alert.     Motor: No abnormal muscle tone.     Coordination: Coordination normal.     ED Results / Procedures / Treatments   Labs (all labs ordered are listed, but only abnormal results are displayed) Labs Reviewed - No data to display  EKG None  Radiology DG Chest 2 View  Result Date: 01/07/2020 CLINICAL DATA:  Cough, body aches, chills, sore throat. EXAM: CHEST - 2 VIEW COMPARISON:  Chest x-ray dated 10/19/2019 FINDINGS: Heart size and mediastinal contours are normal. Lungs are clear. No pleural effusion or pneumothorax is seen. Osseous structures  about the chest are unremarkable. IMPRESSION: Normal chest x-ray.  No evidence of pneumonia. Electronically Signed   By: 10/21/2019 M.D.   On: 01/07/2020 09:04    Procedures Procedures (including critical care time)  Medications Ordered in ED Medications - No data to display  ED Course  I have reviewed the triage vital signs and the nursing notes.  Pertinent labs & imaging results that were available during my care of the patient were reviewed by me and considered in my medical decision making (see chart for details).    MDM  Rules/Calculators/A&P                          Kristina Sellers was evaluated in Emergency Department on 01/07/20 for the symptoms described in the history of present illness. He/she was evaluated in the context of the global COVID-19 pandemic, which necessitated consideration that the patient might be at risk for infection with the SARS-CoV-2 virus that causes COVID-19. Institutional protocols and algorithms that pertain to the evaluation of patients at risk for COVID-19 are in a state of rapid change based on information released by regulatory bodies including the CDC and federal and state organizations. These policies and algorithms were followed during the patient's care in the ED.  49 year old female presenting to the ED for 2-day history of body aches, dry cough, bilateral ear pain, sinus pressure and sore throat.  She has been vaccinated against Covid and flu.  Minimal improvement noted with Alka-Seltzer last night.  Denies any chest pain, shortness of breath.  On exam she has bilateral effusions without evidence of perforation or infection.  Lungs are clear to auscultation bilaterally.  She is speaking complete sentences without respiratory distress.  Exam of the posterior oropharynx is unremarkable, no evidence of RPA or PTA.  Patient declining Covid and flu test but is requesting a chest x-ray to evaluate for pneumonia.  Chest x-ray is negative for pneumonia or  other abnormalities.  Vital signs remained within normal limits, she is not tachycardic, tachypneic or hypoxic.  Suspect viral cause of symptoms.  Will treat symptomatically with antitussives and nasal spray.  Advised her to increase hydration and take Tylenol as needed to help with discomfort or body aches.  Return precautions given.  All imaging, if done today, including plain films, CT scans, and ultrasounds, independently reviewed by me, and interpretations confirmed via formal radiology reads.  Patient is hemodynamically stable, in NAD, and able to ambulate in the ED. Evaluation does not show pathology that would require ongoing emergent intervention or inpatient treatment. I explained the diagnosis to the patient. Pain has been managed and has no complaints prior to discharge. Patient is comfortable with above plan and is stable for discharge at this time. All questions were answered prior to disposition. Strict return precautions for returning to the ED were discussed. Encouraged follow up with PCP.   An After Visit Summary was printed and given to the patient.   Portions of this note were generated with Scientist, clinical (histocompatibility and immunogenetics). Dictation errors may occur despite best attempts at proofreading.  Final Clinical Impression(s) / ED Diagnoses Final diagnoses:  Viral URI with cough  Sinus congestion    Rx / DC Orders ED Discharge Orders         Ordered    fluticasone (FLONASE) 50 MCG/ACT nasal spray  Daily        01/07/20 0919    benzonatate (TESSALON) 100 MG capsule  Every 8 hours        01/07/20 0919           Dietrich Pates, PA-C 01/07/20 0920    Bethann Berkshire, MD 01/08/20 [phone]

## 2020-01-07 NOTE — Discharge Instructions (Addendum)
Take the medications along with Tylenol to help with pain. Make sure you drink plenty of fluids to prevent dehydration. Your x-ray did not show any evidence of pneumonia or other abnormalities. Return to the ER if you start to experience worsening symptoms, drainage from the ear, chest pain, shortness of breath.

## 2020-01-07 NOTE — ED Triage Notes (Signed)
Friday developed ear pain, sore throat, body aches and cough.

## 2020-05-30 ENCOUNTER — Emergency Department (HOSPITAL_COMMUNITY)
Admission: EM | Admit: 2020-05-30 | Discharge: 2020-05-30 | Disposition: A | Discharge status: Home / Self Care | Payer: 59 | Attending: Emergency Medicine | Admitting: Emergency Medicine

## 2020-05-30 ENCOUNTER — Encounter (HOSPITAL_COMMUNITY): Payer: Self-pay | Admitting: Emergency Medicine

## 2020-05-30 ENCOUNTER — Other Ambulatory Visit: Payer: Self-pay

## 2020-05-30 DIAGNOSIS — I10 Essential (primary) hypertension: Secondary | ICD-10-CM | POA: Diagnosis not present

## 2020-05-30 DIAGNOSIS — R0602 Shortness of breath: Secondary | ICD-10-CM | POA: Insufficient documentation

## 2020-05-30 DIAGNOSIS — Z7951 Long term (current) use of inhaled steroids: Secondary | ICD-10-CM | POA: Diagnosis not present

## 2020-05-30 DIAGNOSIS — R079 Chest pain, unspecified: Secondary | ICD-10-CM | POA: Insufficient documentation

## 2020-05-30 DIAGNOSIS — R42 Dizziness and giddiness: Secondary | ICD-10-CM | POA: Insufficient documentation

## 2020-05-30 DIAGNOSIS — Z79899 Other long term (current) drug therapy: Secondary | ICD-10-CM | POA: Diagnosis not present

## 2020-05-30 DIAGNOSIS — Z7901 Long term (current) use of anticoagulants: Secondary | ICD-10-CM | POA: Insufficient documentation

## 2020-05-30 DIAGNOSIS — J45909 Unspecified asthma, uncomplicated: Secondary | ICD-10-CM | POA: Diagnosis not present

## 2020-05-30 LAB — URINALYSIS, ROUTINE W REFLEX MICROSCOPIC
Bilirubin Urine: NEGATIVE
Glucose, UA: NEGATIVE mg/dL
Hgb urine dipstick: NEGATIVE
Ketones, ur: NEGATIVE mg/dL
Leukocytes,Ua: NEGATIVE
Nitrite: NEGATIVE
Protein, ur: NEGATIVE mg/dL
Specific Gravity, Urine: 1.015 (ref 1.005–1.030)
pH: 7 (ref 5.0–8.0)

## 2020-05-30 LAB — CBC
HCT: 41.7 % (ref 36.0–46.0)
Hemoglobin: 13.5 g/dL (ref 12.0–15.0)
MCH: 28.6 pg (ref 26.0–34.0)
MCHC: 32.4 g/dL (ref 30.0–36.0)
MCV: 88.3 fL (ref 80.0–100.0)
Platelets: 202 10*3/uL (ref 150–400)
RBC: 4.72 MIL/uL (ref 3.87–5.11)
RDW: 12.9 % (ref 11.5–15.5)
WBC: 10.1 10*3/uL (ref 4.0–10.5)
nRBC: 0 % (ref 0.0–0.2)

## 2020-05-30 LAB — TROPONIN I (HIGH SENSITIVITY)
Troponin I (High Sensitivity): 3 ng/L (ref <18)
Troponin I (High Sensitivity): 3 ng/L (ref <18)

## 2020-05-30 LAB — BASIC METABOLIC PANEL
Anion gap: 7 (ref 5–15)
BUN: 13 mg/dL (ref 6–20)
CO2: 32 mmol/L (ref 22–32)
Calcium: 9.5 mg/dL (ref 8.9–10.3)
Chloride: 104 mmol/L (ref 98–111)
Creatinine, Ser: 0.79 mg/dL (ref 0.44–1.00)
GFR, Estimated: >60 mL/min (ref >60)
Glucose, Bld: 84 mg/dL (ref 70–99)
Potassium: 3.6 mmol/L (ref 3.5–5.1)
Sodium: 143 mmol/L (ref 135–145)

## 2020-05-30 MED ORDER — HYDROCODONE-ACETAMINOPHEN 5-325 MG PO TABS
1.0000 | ORAL_TABLET | Freq: Once | ORAL | Status: AC
  Administered 2020-05-30: 1 via ORAL
  Filled 2020-05-30: qty 1

## 2020-05-30 MED ORDER — ASPIRIN 81 MG PO CHEW
324.0000 mg | CHEWABLE_TABLET | Freq: Once | ORAL | Status: AC
  Administered 2020-05-30: 324 mg via ORAL
  Filled 2020-05-30: qty 4

## 2020-05-30 MED ORDER — KETOROLAC TROMETHAMINE 30 MG/ML IJ SOLN
15.0000 mg | Freq: Once | INTRAMUSCULAR | Status: AC
  Administered 2020-05-30: 15 mg via INTRAVENOUS
  Filled 2020-05-30: qty 1

## 2020-05-30 NOTE — ED Triage Notes (Signed)
BIBA Per EMS:  Pt coming from work with complaints of L chest wall pain since yesterday ; Dizziness SHOB Today; Diarrhea x3days  Pain worse w palp and breathing  Hx PE  153/85  63 HR 100% RA  96 CBG  9/10 pain   20GL hand

## 2020-05-30 NOTE — ED Provider Notes (Signed)
[facility]-EMERGENCY DEPT Provider Note   CSN: [id number] Arrival date & time: 05/30/20  4315     History Chief Complaint  Patient presents with  . Chest Pain  . Dizziness  . Diarrhea    Janasia Coverdale is a 50 y.o. female.  HPI  HPI: A 50 year old patient with a history of hypertension presents for evaluation of chest pain. Initial onset of pain was less than one hour ago. The patient's chest pain is not worse with exertion. The patient's chest pain is middle- or left-sided, is not well-localized, is not described as heaviness/pressure/tightness, is not sharp and does radiate to the arms/jaw/neck. The patient does not complain of nausea and denies diaphoresis. The patient has no history of stroke, has no history of peripheral artery disease, has not smoked in the past 90 days, denies any history of treated diabetes, has no relevant family history of coronary artery disease (first degree relative at less than age 54), has no history of hypercholesterolemia and does not have an elevated BMI (>=30).   Patient has history of hypertension, PE -on Xarelto. Reports 2-day history of intermittent episodes of dizziness described as lightheadedness and spinning sensation.  Today she had some shortness of breath while at work and along with it she started having some left-sided chest pain.  Pain is described as dull pain that is not exertional or pleuritic.  Pain is radiating down her left arm and is similar to her PE.  Pain has been constant for  Past Medical History:  Diagnosis Date  . Hypertension     Patient Active Problem List   Diagnosis Date Noted  . Dyspnea on exertion 11/01/2019  . Pulmonary embolism (HCC) 09/15/2019  . Pulmonary nodule 09/15/2019  . Asthma 09/15/2019    Past Surgical History:  Procedure Laterality Date  . ABDOMINAL HYSTERECTOMY    . BREAST SURGERY       OB History   No obstetric history on file.     Family History  Adopted: Yes     Social History   Tobacco Use  . Smoking status: Never Smoker  . Smokeless tobacco: Never Used  Vaping Use  . Vaping Use: Never used  Substance Use Topics  . Alcohol use: Never  . Drug use: Never    Home Medications Prior to Admission medications   Medication Sig Start Date End Date Taking? Authorizing Provider  albuterol (VENTOLIN HFA) 108 (90 Base) MCG/ACT inhaler Inhale 2 puffs into the lungs every 6 (six) hours as needed for wheezing or shortness of breath.    [provider]  amLODipine (NORVASC) 10 MG tablet Take 10 mg by mouth daily. 07/05/19   [provider]  benzonatate (TESSALON) 100 MG capsule Take 1 capsule (100 mg total) by mouth every 8 (eight) hours. 01/07/20   Khatri, Hina, PA-C  fluticasone (FLONASE) 50 MCG/ACT nasal spray Place 1 spray into both nostrils daily. 01/07/20   Khatri, Hina, PA-C  hydrochlorothiazide (HYDRODIURIL) 25 MG tablet Take 25 mg by mouth every morning. 07/05/19   [provider]  linaclotide (LINZESS) 290 MCG CAPS capsule Take 290 mcg by mouth daily as needed (dizziness).    [provider]  RIVAROXABAN Carlena Hurl) VTE STARTER PACK (15 & 20 MG TABLETS) Follow package directions: Take one 15mg  tablet by mouth twice a day. On day 22, switch to one 20mg  tablet once a day. Take with food. 07/15/19   A, PA-C  sodium chloride (OCEAN) 0.65 % SOLN nasal spray  Place 1 spray into both nostrils daily as needed for congestion.    [provider]    Allergies    Other and Penicillins  Review of Systems   Review of Systems  Constitutional: Positive for activity change.  Respiratory: Positive for shortness of breath.   Cardiovascular: Positive for chest pain.  Genitourinary: Positive for dysuria.  Neurological: Positive for dizziness.  All other systems reviewed and are negative.   Physical Exam Updated Vital Signs BP 127/85   Pulse (!) 54   Temp 98.4 F (36.9 C) (Oral)   Resp 19   SpO2 100%    Physical Exam Vitals and nursing note reviewed.  Constitutional:      Appearance: She is well-developed.  HENT:     Head: Normocephalic and atraumatic.  Eyes:     Extraocular Movements: Extraocular movements intact.     Pupils: Pupils are equal, round, and reactive to light.  Cardiovascular:     Rate and Rhythm: Normal rate.     Comments: Reproducible left-sided chest wall pain-pain worse with palpation of the chest wall and with movement of the upper extremity Pulmonary:     Effort: Pulmonary effort is normal.  Abdominal:     General: Bowel sounds are normal.  Musculoskeletal:     Cervical back: Normal range of motion and neck supple.  Skin:    General: Skin is warm and dry.  Neurological:     Mental Status: She is alert and oriented to person, place, and time.     ED Results / Procedures / Treatments   Labs (all labs ordered are listed, but only abnormal results are displayed) Labs Reviewed  BASIC METABOLIC PANEL  CBC  URINALYSIS, ROUTINE W REFLEX MICROSCOPIC  TROPONIN I (HIGH SENSITIVITY)  TROPONIN I (HIGH SENSITIVITY)    EKG EKG Interpretation  Date/Time:  Wednesday May 30 2020 09:46:12 EDT Ventricular Rate:  63 PR Interval:  139 QRS Duration: 84 QT Interval:  415 QTC Calculation: 425 R Axis:   20 Text Interpretation: Sinus rhythm Abnormal R-wave progression, early transition LVH by voltage Borderline T abnormalities, anterior leads Nonspecific ST and T wave abnormality No significant change since last tracing Confirmed by Derwood Kaplan [phone]) on 05/30/2020 11:25:01 AM   Radiology No results found.  Procedures Procedures   Medications Ordered in ED Medications  ketorolac (TORADOL) 30 MG/ML injection 15 mg (15 mg Intravenous Given 05/30/20 1302)  aspirin chewable tablet 324 mg (324 mg Oral Given 05/30/20 1301)    ED Course  I have reviewed the triage vital signs and the nursing notes.  Pertinent labs & imaging results that were available during  my care of the patient were reviewed by me and considered in my medical decision making (see chart for details).    MDM Rules/Calculators/A&P HEAR Score: 3                        50 year old comes in a chief complaint of chest pain, dizziness and shortness of breath.  Symptoms started with dizziness that started 2 days ago.  Dizziness is intermittent, unprovoked and not necessarily exertional.  It is described as spinning sensation.  No history of TIA, stroke.  No other focal neurologic symptoms associated with the dizziness.  She denies any cardiac prodrome or near syncope type feeling with it.  Patient today had some shortness of breath while at work and with that chest pain.  Left-sided pain that is radiating down the left arm without  pleuritic component.  She has history of PE and reports that the pain reminds her of the PE.  She is taking Xarelto as prescribed.  Patient has normal hemodynamics, O2 sats 100%.  EKG is not showing any acute signs of ischemia.  Hear score is 3.  Delta troponin ordered.  It seems like she had a stress test ordered but she has not completed that.  I do think that she will likely benefit from getting the stress test, and we have communicated our desire for the stress test to be done ASAP to the patient.  Although patient reports having similar pain with PE, I do not think a CT PE will be very helpful.  Plan is to ambulate the patient and do ambulatory pulse ox.  If there is gross abnormality then we will proceed with CT scan for what could be clinically significant worsening of the PE.  On my exam, the chest pain was very reproducible with palpation, which makes me think that the pain is more chest wall/MS pain   1:48 PM  Patient reassessed. Pt is comfortable at this time.  Results of the workup discussed. Strict ER return precautions discussed. Follow up instruction discussed, and pt agrees with the plan and is comfortable with it.  Final Clinical Impression(s) /  ED Diagnoses Final diagnoses:  Nonspecific chest pain    Rx / DC Orders ED Discharge Orders    None       Derwood Kaplan, MD 05/30/20 1348

## 2020-05-30 NOTE — Discharge Instructions (Addendum)

## 2020-12-05 ENCOUNTER — Other Ambulatory Visit: Payer: Self-pay

## 2020-12-05 ENCOUNTER — Emergency Department (HOSPITAL_COMMUNITY)
Admission: EM | Admit: 2020-12-05 | Discharge: 2020-12-05 | Disposition: A | Discharge status: Home / Self Care | Payer: 59 | Attending: Emergency Medicine | Admitting: Emergency Medicine

## 2020-12-05 ENCOUNTER — Encounter (HOSPITAL_COMMUNITY): Payer: Self-pay

## 2020-12-05 DIAGNOSIS — I10 Essential (primary) hypertension: Secondary | ICD-10-CM | POA: Diagnosis not present

## 2020-12-05 DIAGNOSIS — Z7901 Long term (current) use of anticoagulants: Secondary | ICD-10-CM | POA: Diagnosis not present

## 2020-12-05 DIAGNOSIS — Z79899 Other long term (current) drug therapy: Secondary | ICD-10-CM | POA: Diagnosis not present

## 2020-12-05 DIAGNOSIS — J45909 Unspecified asthma, uncomplicated: Secondary | ICD-10-CM | POA: Insufficient documentation

## 2020-12-05 DIAGNOSIS — R197 Diarrhea, unspecified: Secondary | ICD-10-CM

## 2020-12-05 DIAGNOSIS — R0981 Nasal congestion: Secondary | ICD-10-CM | POA: Diagnosis present

## 2020-12-05 DIAGNOSIS — R519 Headache, unspecified: Secondary | ICD-10-CM | POA: Insufficient documentation

## 2020-12-05 DIAGNOSIS — R112 Nausea with vomiting, unspecified: Secondary | ICD-10-CM | POA: Diagnosis not present

## 2020-12-05 DIAGNOSIS — H1032 Unspecified acute conjunctivitis, left eye: Secondary | ICD-10-CM | POA: Diagnosis not present

## 2020-12-05 DIAGNOSIS — Z20822 Contact with and (suspected) exposure to covid-19: Secondary | ICD-10-CM | POA: Diagnosis not present

## 2020-12-05 DIAGNOSIS — R509 Fever, unspecified: Secondary | ICD-10-CM | POA: Diagnosis not present

## 2020-12-05 LAB — RESP PANEL BY RT-PCR (FLU A&B, COVID) ARPGX2
Influenza A by PCR: NEGATIVE
Influenza B by PCR: NEGATIVE
SARS Coronavirus 2 by RT PCR: NEGATIVE

## 2020-12-05 MED ORDER — ONDANSETRON 4 MG PO TBDP: 4.0000 mg | ORAL_TABLET | Freq: Three times a day (TID) | ORAL | 0 refills | Status: AC | PRN

## 2020-12-05 MED ORDER — ERYTHROMYCIN 5 MG/GM OP OINT: TOPICAL_OINTMENT | OPHTHALMIC | 0 refills | Status: DC

## 2020-12-05 NOTE — Discharge Instructions (Signed)
There is a prescription sent to your pharmacy for an antibiotic ointment.  Placed on your affected eye 3 times a day.  It does have soothing qualities.  Additionally, there is a prescription for Zofran.  Take this only as needed for nausea and vomiting.  Continue to drink plenty of fluids to replace losses from diarrhea.  This is likely a viral illness and symptoms should improve within the next few days.  If you have persistent symptoms or any worsening, please return to the emergency department.

## 2020-12-05 NOTE — ED Triage Notes (Addendum)
Pt reports diarrhea, vomiting, left pink eye, and fever since yesterday. 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 Pt states that she took Tylenol before she came in.

## 2020-12-05 NOTE — ED Provider Notes (Signed)
[facility]-EMERGENCY DEPT Provider Note   CSN: [id number] Arrival date & time: 12/05/20  9485     History Chief Complaint  Patient presents with   Diarrhea   Emesis   Conjunctivitis   Fever    Kristina Sellers is a 50 y.o. female.   Diarrhea Associated symptoms: chills, fever, headaches and vomiting   Associated symptoms: no abdominal pain, no arthralgias, no diaphoresis and no myalgias   Emesis Associated symptoms: chills, diarrhea, fever and headaches   Associated symptoms: no abdominal pain, no arthralgias, no cough, no myalgias and no sore throat   Conjunctivitis Associated symptoms include headaches. Pertinent negatives include no chest pain, no abdominal pain and no shortness of breath.  Fever Associated symptoms: chills, congestion, diarrhea, headaches, nausea, rhinorrhea and vomiting   Associated symptoms: no chest pain, no confusion, no cough, no dysuria, no ear pain, no myalgias, no rash and no sore throat   Patient presents for fever, nausea, vomiting, diarrhea, and left eye redness.  Additionally, she has had intermittent headache.  She has not had myalgias or arthralgias.  She has not had abdominal pain.  Onset of symptoms was yesterday.  She works in Aud[employer] and feels like she may have acquired a viral illness from her work.  Emesis and diarrhea have been nonbloody.  Symptoms have been intermittent.  Patient has been able to tolerate p.o. intake at home.  Last antipyresis was this morning (Tylenol).  Patient endorses pain to her left eye without changes in her vision, pain with EOM, photosensitivity, or itchiness.  She denies any ophthalmologic history to her left eye.  She does not wear contact lenses.  Currently, following her morning dose of Tylenol, she denies any symptoms.    Past Medical History:  Diagnosis Date   Hypertension     Patient Active Problem List   Diagnosis Date Noted   Dyspnea on exertion 11/01/2019   Pulmonary embolism  (HCC) 09/15/2019   Pulmonary nodule 09/15/2019   Asthma 09/15/2019    Past Surgical History:  Procedure Laterality Date   ABDOMINAL HYSTERECTOMY     BREAST SURGERY       OB History   No obstetric history on file.     Family History  Adopted: Yes    Social History   Tobacco Use   Smoking status: Never   Smokeless tobacco: Never  Vaping Use   Vaping Use: Never used  Substance Use Topics   Alcohol use: Never   Drug use: Never    Home Medications Prior to Admission medications   Medication Sig Start Date End Date Taking? Authorizing Provider  erythromycin ophthalmic ointment Place a 1/2 inch ribbon of ointment into the lower eyelid 3 times a day for 5 days. 12/05/20  Yes Gloris Manchester, MD  ondansetron (ZOFRAN ODT) 4 MG disintegrating tablet Take 1 tablet (4 mg total) by mouth every 8 (eight) hours as needed for nausea or vomiting. 12/05/20  Yes Gloris Manchester, MD  albuterol (VENTOLIN HFA) 108 (90 Base) MCG/ACT inhaler Inhale 2 puffs into the lungs every 6 (six) hours as needed for wheezing or shortness of breath.    [provider]  amLODipine (NORVASC) 10 MG tablet Take 10 mg by mouth daily. 07/05/19   [provider]  benzonatate (TESSALON) 100 MG capsule Take 1 capsule (100 mg total) by mouth every 8 (eight) hours. 01/07/20   Khatri, Hina, PA-C  fluticasone (FLONASE) 50 MCG/ACT nasal spray Place 1 spray into both nostrils daily. 01/07/20  Khatri, Hina, PA-C  hydrochlorothiazide (HYDRODIURIL) 25 MG tablet Take 25 mg by mouth every morning. 07/05/19   [provider]  linaclotide (LINZESS) 290 MCG CAPS capsule Take 290 mcg by mouth daily as needed (dizziness).    [provider]  RIVAROXABAN Alveda Reasons) VTE STARTER PACK (15 & 20 MG TABLETS) Follow package directions: Take one 15mg  tablet by mouth twice a day. On day 22, switch to one 20mg  tablet once a day. Take with food. 07/15/19   Nuala Alpha A, PA-C  sodium chloride (OCEAN) 0.65 % SOLN nasal  spray Place 1 spray into both nostrils daily as needed for congestion.    [provider]    Allergies    Other and Penicillins  Review of Systems   Review of Systems  Constitutional:  Positive for chills and fever. Negative for activity change and diaphoresis.  HENT:  Positive for congestion and rhinorrhea. Negative for ear pain, sore throat, trouble swallowing and voice change.   Eyes:  Positive for pain and redness. Negative for photophobia, discharge, itching and visual disturbance.  Respiratory:  Negative for cough, chest tightness, shortness of breath and wheezing.   Cardiovascular:  Negative for chest pain and palpitations.  Gastrointestinal:  Positive for diarrhea, nausea and vomiting. Negative for abdominal distention, abdominal pain and blood in stool.  Genitourinary:  Negative for dysuria, flank pain and hematuria.  Musculoskeletal:  Negative for arthralgias, back pain, joint swelling, myalgias and neck pain.  Skin:  Negative for color change and rash.  Neurological:  Positive for headaches. Negative for dizziness, seizures, syncope, weakness, light-headedness and numbness.  Psychiatric/Behavioral:  Negative for confusion and decreased concentration.   All other systems reviewed and are negative.  Physical Exam Updated Vital Signs BP (!) 138/92 (BP Location: Left Arm)   Pulse 64   Temp 98.3 F (36.8 C) (Oral)   Resp 16   SpO2 100%   Physical Exam Vitals and nursing note reviewed.  Constitutional:      General: She is not in acute distress.    Appearance: Normal appearance. She is well-developed. She is not ill-appearing, toxic-appearing or diaphoretic.  HENT:     Head: Normocephalic and atraumatic.     Right Ear: External ear normal.     Left Ear: External ear normal.     Nose: Congestion and rhinorrhea present.     Mouth/Throat:     Mouth: Mucous membranes are moist.     Pharynx: Oropharynx is clear.  Eyes:     Extraocular Movements: Extraocular  movements intact.     Conjunctiva/sclera:     Right eye: Right conjunctiva is not injected. No chemosis or exudate.    Left eye: Left conjunctiva is injected. No chemosis or exudate. Cardiovascular:     Rate and Rhythm: Normal rate and regular rhythm.     Heart sounds: No murmur heard. Pulmonary:     Effort: Pulmonary effort is normal. No respiratory distress.     Breath sounds: Normal breath sounds.  Abdominal:     Palpations: Abdomen is soft.     Tenderness: There is no abdominal tenderness.  Musculoskeletal:     Cervical back: Neck supple.     Right lower leg: No edema.     Left lower leg: No edema.  Skin:    General: Skin is warm and dry.     Capillary Refill: Capillary refill takes less than 2 seconds.     Coloration: Skin is not jaundiced or pale.  Neurological:  General: No focal deficit present.     Mental Status: She is alert and oriented to person, place, and time.     Cranial Nerves: No cranial nerve deficit.     Sensory: No sensory deficit.     Motor: No weakness.  Psychiatric:        Mood and Affect: Mood normal.        Behavior: Behavior normal.        Thought Content: Thought content normal.        Judgment: Judgment normal.    ED Results / Procedures / Treatments   Labs (all labs ordered are listed, but only abnormal results are displayed) Labs Reviewed  RESP PANEL BY RT-PCR (FLU A&B, COVID) ARPGX2    EKG None  Radiology No results found.  Procedures Procedures   Medications Ordered in ED Medications - No data to display  ED Course  I have reviewed the triage vital signs and the nursing notes.  Pertinent labs & imaging results that were available during my care of the patient were reviewed by me and considered in my medical decision making (see chart for details).    MDM Rules/Calculators/A&P                          Patient presents for 1 day of fever, headache, nausea, vomiting, diarrhea, and left eye pain and redness.  Symptoms are  all consistent with viral illness.  COVID and are negative.  She has been effectively treating her symptoms at home with Tylenol.  On exam, she is well-appearing.  Left eye has conjunctival injection without ciliary flush or discharge.  Visual acuity is intact.  She denies foreign body sensation or photosensitivity.  Erythromycin ointment was prescribed.  As needed Zofran was prescribed as well.  Patient was encouraged to continue to drink plenty of fluids to replace losses from diarrhea.  Patient was advised to return to the ED if she does experience worsening or persistent symptoms.  She was discharged in good condition.  Final Clinical Impression(s) / ED Diagnoses Final diagnoses:  Nausea vomiting and diarrhea  Acute conjunctivitis of left eye, unspecified acute conjunctivitis type    Rx / DC Orders ED Discharge Orders          Ordered    erythromycin ophthalmic ointment        12/05/20 0859    ondansetron (ZOFRAN ODT) 4 MG disintegrating tablet  Every 8 hours PRN        12/05/20 0859             Godfrey Pick, MD 12/05/20 1739

## 2021-01-18 ENCOUNTER — Emergency Department (HOSPITAL_COMMUNITY)
Admission: EM | Admit: 2021-01-18 | Discharge: 2021-01-18 | Disposition: A | Discharge status: Home / Self Care | Payer: 59 | Attending: Emergency Medicine | Admitting: Emergency Medicine

## 2021-01-18 ENCOUNTER — Emergency Department (HOSPITAL_COMMUNITY): Payer: 59

## 2021-01-18 ENCOUNTER — Encounter (HOSPITAL_COMMUNITY): Payer: Self-pay | Admitting: Emergency Medicine

## 2021-01-18 DIAGNOSIS — I10 Essential (primary) hypertension: Secondary | ICD-10-CM | POA: Insufficient documentation

## 2021-01-18 DIAGNOSIS — R519 Headache, unspecified: Secondary | ICD-10-CM | POA: Insufficient documentation

## 2021-01-18 DIAGNOSIS — Z91040 Latex allergy status: Secondary | ICD-10-CM | POA: Diagnosis not present

## 2021-01-18 DIAGNOSIS — Y900 Blood alcohol level of less than 20 mg/100 ml: Secondary | ICD-10-CM | POA: Diagnosis not present

## 2021-01-18 DIAGNOSIS — Z20822 Contact with and (suspected) exposure to covid-19: Secondary | ICD-10-CM | POA: Insufficient documentation

## 2021-01-18 DIAGNOSIS — J45909 Unspecified asthma, uncomplicated: Secondary | ICD-10-CM | POA: Diagnosis not present

## 2021-01-18 DIAGNOSIS — M79602 Pain in left arm: Secondary | ICD-10-CM | POA: Insufficient documentation

## 2021-01-18 DIAGNOSIS — Z79899 Other long term (current) drug therapy: Secondary | ICD-10-CM | POA: Diagnosis not present

## 2021-01-18 DIAGNOSIS — R29898 Other symptoms and signs involving the musculoskeletal system: Secondary | ICD-10-CM

## 2021-01-18 DIAGNOSIS — Z7952 Long term (current) use of systemic steroids: Secondary | ICD-10-CM | POA: Diagnosis not present

## 2021-01-18 DIAGNOSIS — M6281 Muscle weakness (generalized): Secondary | ICD-10-CM | POA: Diagnosis not present

## 2021-01-18 LAB — URINALYSIS, ROUTINE W REFLEX MICROSCOPIC
Bilirubin Urine: NEGATIVE
Glucose, UA: NEGATIVE mg/dL
Hgb urine dipstick: NEGATIVE
Ketones, ur: NEGATIVE mg/dL
Leukocytes,Ua: NEGATIVE
Nitrite: NEGATIVE
Protein, ur: NEGATIVE mg/dL
Specific Gravity, Urine: 1.025 (ref 1.005–1.030)
pH: 6 (ref 5.0–8.0)

## 2021-01-18 LAB — COMPREHENSIVE METABOLIC PANEL
ALT: 17 U/L (ref 0–44)
AST: 24 U/L (ref 15–41)
Albumin: 3.6 g/dL (ref 3.5–5.0)
Alkaline Phosphatase: 71 U/L (ref 38–126)
Anion gap: 6 (ref 5–15)
BUN: 13 mg/dL (ref 6–20)
CO2: 31 mmol/L (ref 22–32)
Calcium: 9.3 mg/dL (ref 8.9–10.3)
Chloride: 101 mmol/L (ref 98–111)
Creatinine, Ser: 0.93 mg/dL (ref 0.44–1.00)
GFR, Estimated: >60 mL/min (ref >60)
Glucose, Bld: 102 mg/dL — ABNORMAL HIGH (ref 70–99)
Potassium: 3.4 mmol/L — ABNORMAL LOW (ref 3.5–5.1)
Sodium: 138 mmol/L (ref 135–145)
Total Bilirubin: 0.7 mg/dL (ref 0.3–1.2)
Total Protein: 7.6 g/dL (ref 6.5–8.1)

## 2021-01-18 LAB — DIFFERENTIAL
Abs Immature Granulocytes: 0.04 10*3/uL (ref 0.00–0.07)
Basophils Absolute: 0.1 10*3/uL (ref 0.0–0.1)
Basophils Relative: 0 %
Eosinophils Absolute: 0.2 10*3/uL (ref 0.0–0.5)
Eosinophils Relative: 1 %
Immature Granulocytes: 0 %
Lymphocytes Relative: 26 %
Lymphs Abs: 3 10*3/uL (ref 0.7–4.0)
Monocytes Absolute: 0.6 10*3/uL (ref 0.1–1.0)
Monocytes Relative: 5 %
Neutro Abs: 7.6 10*3/uL (ref 1.7–7.7)
Neutrophils Relative %: 68 %

## 2021-01-18 LAB — CBC
HCT: 41.6 % (ref 36.0–46.0)
Hemoglobin: 13.2 g/dL (ref 12.0–15.0)
MCH: 28.6 pg (ref 26.0–34.0)
MCHC: 31.7 g/dL (ref 30.0–36.0)
MCV: 90 fL (ref 80.0–100.0)
Platelets: 198 10*3/uL (ref 150–400)
RBC: 4.62 MIL/uL (ref 3.87–5.11)
RDW: 13.2 % (ref 11.5–15.5)
WBC: 11.3 10*3/uL — ABNORMAL HIGH (ref 4.0–10.5)
nRBC: 0 % (ref 0.0–0.2)

## 2021-01-18 LAB — PROTIME-INR
INR: 1.6 — ABNORMAL HIGH (ref 0.8–1.2)
Prothrombin Time: 19 seconds — ABNORMAL HIGH (ref 11.4–15.2)

## 2021-01-18 LAB — RAPID URINE DRUG SCREEN, HOSP PERFORMED
Amphetamines: NOT DETECTED
Barbiturates: NOT DETECTED
Benzodiazepines: NOT DETECTED
Cocaine: NOT DETECTED
Opiates: NOT DETECTED
Tetrahydrocannabinol: POSITIVE — AB

## 2021-01-18 LAB — RESP PANEL BY RT-PCR (FLU A&B, COVID) ARPGX2
Influenza A by PCR: NEGATIVE
Influenza B by PCR: NEGATIVE
SARS Coronavirus 2 by RT PCR: NEGATIVE

## 2021-01-18 LAB — APTT: aPTT: 34 seconds (ref 24–36)

## 2021-01-18 LAB — ETHANOL: Alcohol, Ethyl (B): <10 mg/dL (ref <10)

## 2021-01-18 IMAGING — MR MR CERVICAL SPINE W/O CM
4 of 7 series · 19 of 48 positions shown · non-contrast
Comparison: None.

CLINICAL DATA: Acute myelopathy. Left arm weakness and numbness
since today.

EXAM:
MRI CERVICAL SPINE WITHOUT CONTRAST
TECHNIQUE: Multiplanar, multisequence MR imaging of the cervical spine was
performed. No intravenous contrast was administered.

[Series 2: T2 · sagittal · 3.0mm · 0.43mm/px · 3 of 16 slices shown (1 of 4)]
[im 1/16]
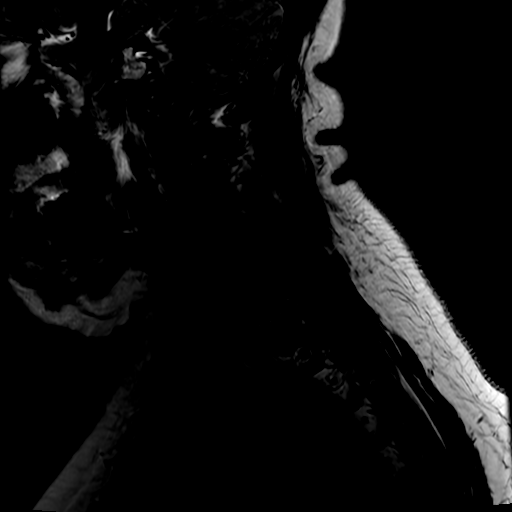
[im 8/16]
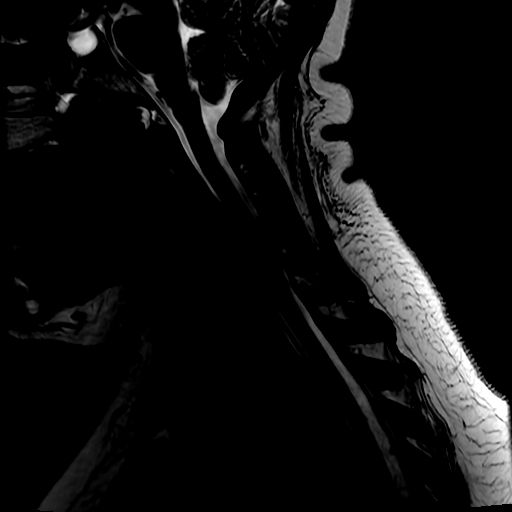
[im 16/16]
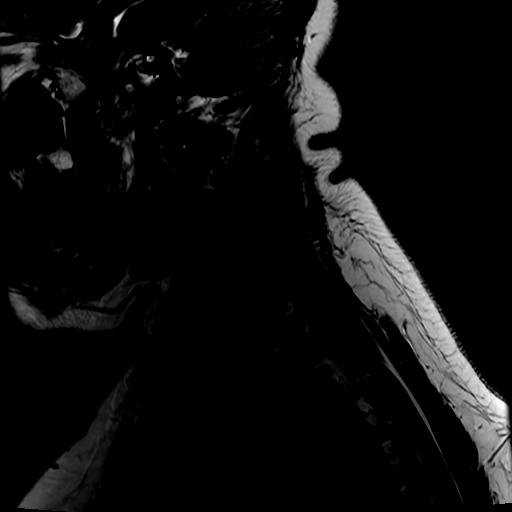

[Series 6: T2 · axial · 3.0mm · 0.35mm/px · z∈[-173,-87]mm · 7 of 28 slices shown (2 of 4)]
[im 1/28]
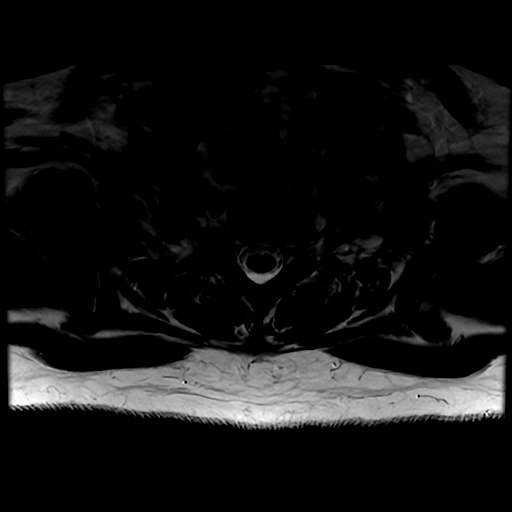
[im 5/28]
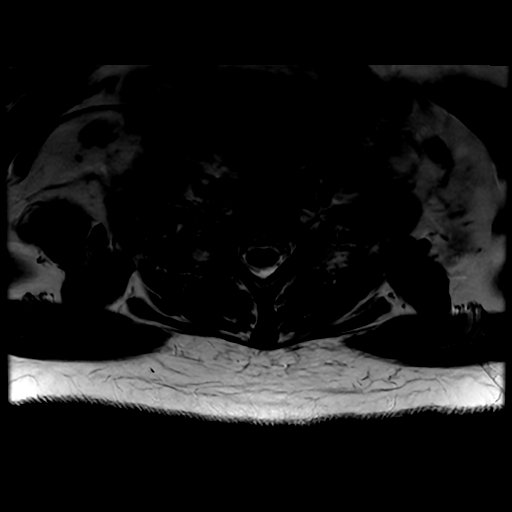
[im 10/28]
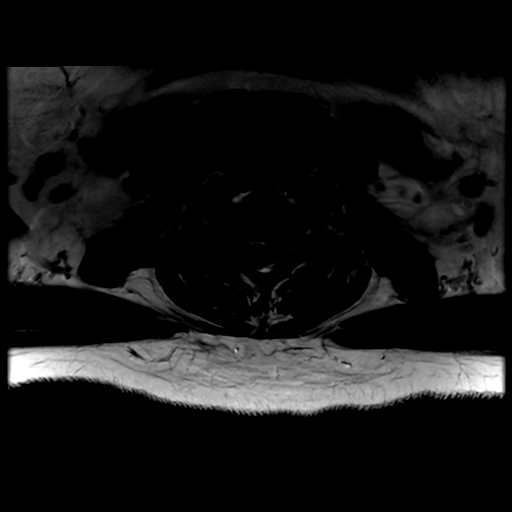
[im 14/28]
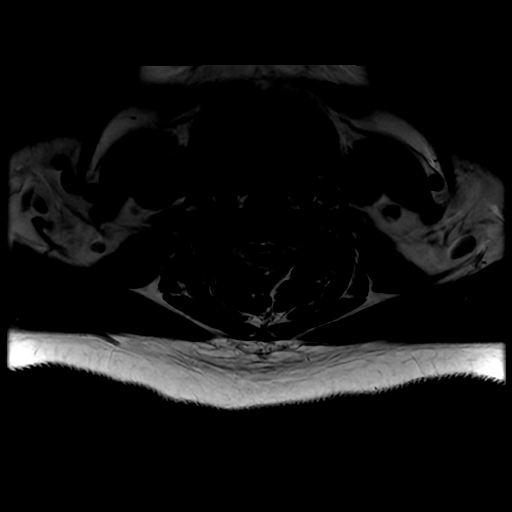
[im 19/28]
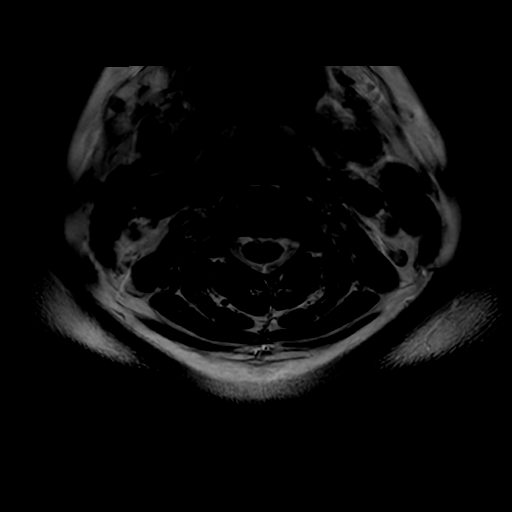
[im 23/28]
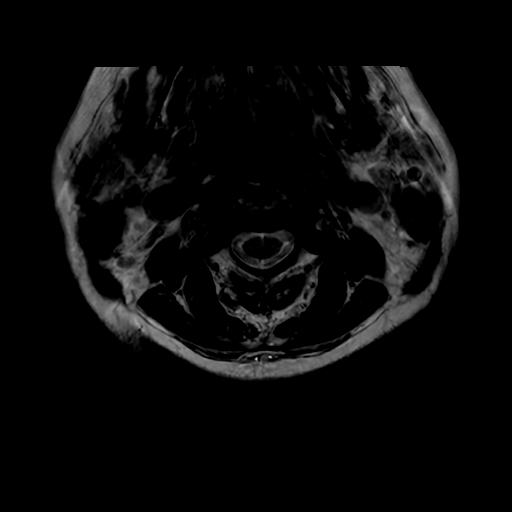
[im 28/28]
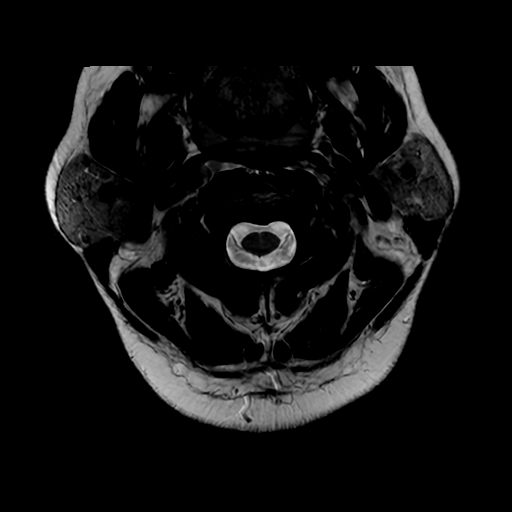

[Series 7: T2 · sagittal · 3.0mm · 0.43mm/px · 4 of 16 slices shown (3 of 4)]
[im 1/16]
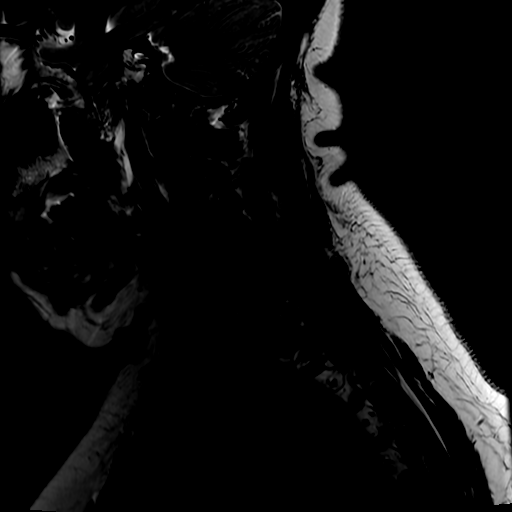
[im 6/16]
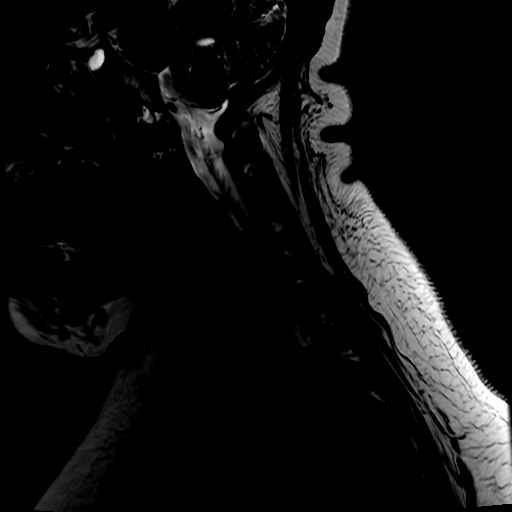
[im 11/16]
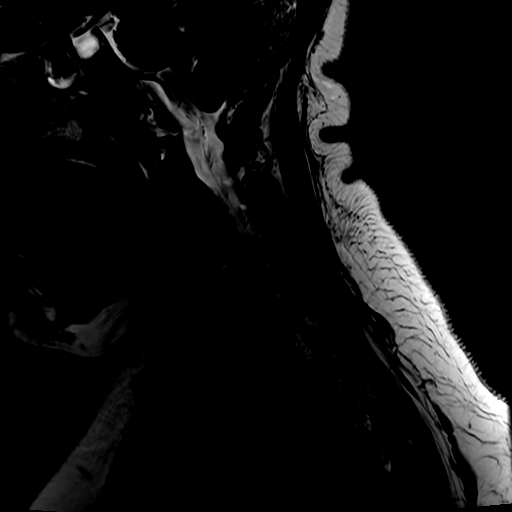
[im 16/16]
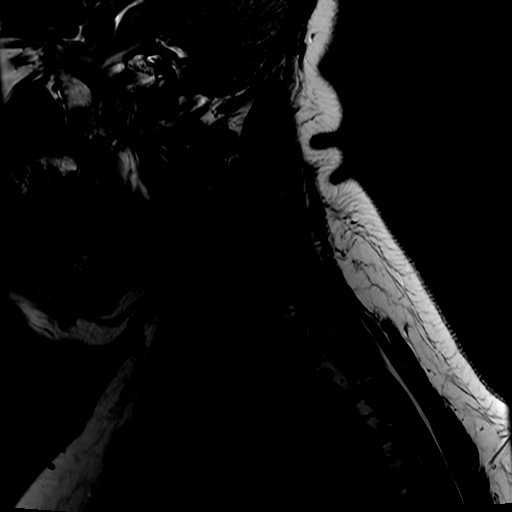

[Series 8: T2 · axial · 3.0mm · 0.35mm/px · z∈[-173,-103]mm · 5 of 28 slices shown (4 of 4)]
[im 1/28]
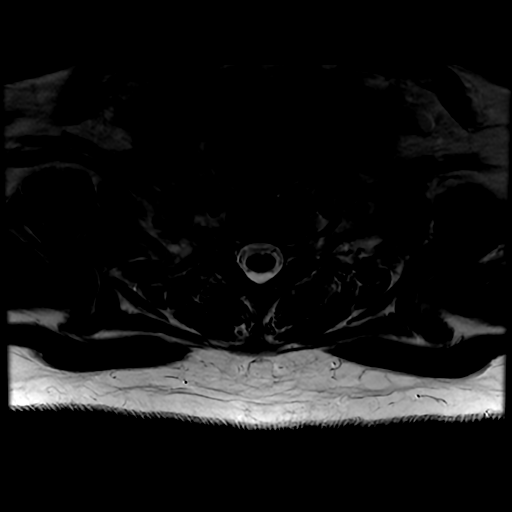
[im 5/28]
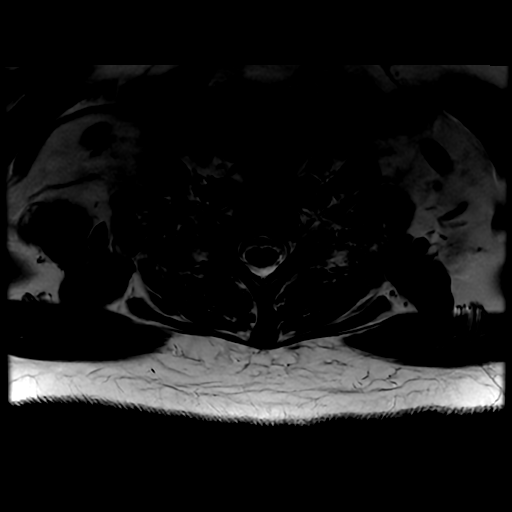
[im 10/28]
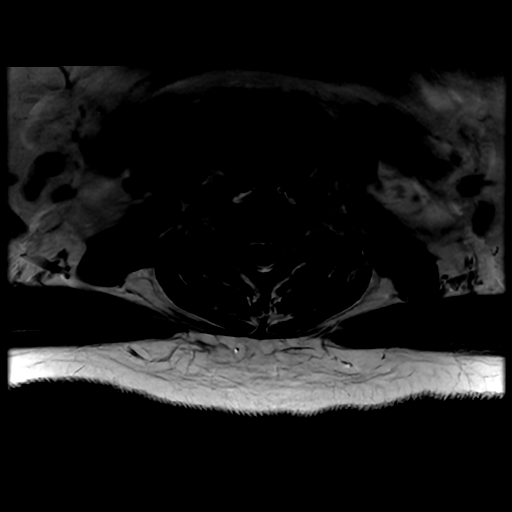
[im 14/28]
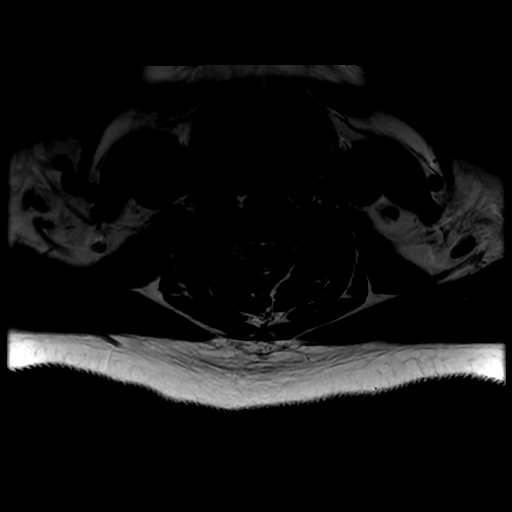
[im 23/28]
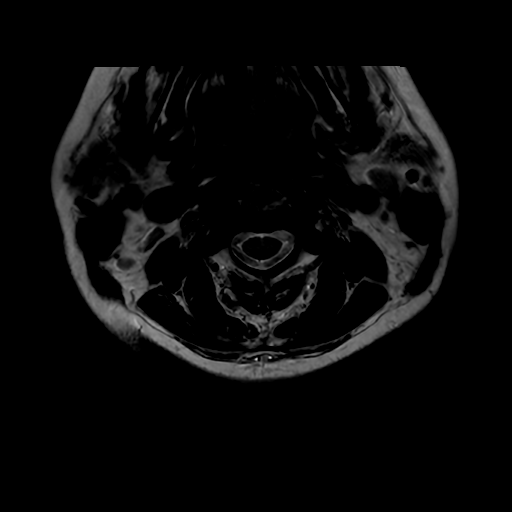

[19 of 48 positions shown; findings below may reference images not displayed]

FINDINGS: Alignment: Mild retrolisthesis C4-5 and C5-6. Mild cervical
kyphosis.

Vertebrae: Negative for fracture or mass

Cord: Cord evaluation limited by mild motion. Allowing for this, no
cord signal abnormality.

Posterior Fossa, vertebral arteries, paraspinal tissues: Negative

Disc levels:

C2-3: Negative

C3-4: Disc degeneration and spurring.  No significant stenosis.

C4-5: Disc degeneration and diffuse uncinate spurring left greater
than right. Moderate to severe left foraminal encroachment. Right
foramen patent.

C5-6: Disc degeneration and diffuse uncinate spurring. Moderate to
severe left foraminal encroachment. Moderate right foraminal
encroachment. Cord flattening with mild spinal stenosis

C6-7: Disc degeneration with diffuse uncinate spurring. Moderate to
severe foraminal encroachment bilaterally

C7-T1: Negative for stenosis
IMPRESSION: Multilevel cervical spondylosis. Bilateral foraminal encroachment
due to spurring as described above.

## 2021-01-18 IMAGING — CT CT HEAD W/O CM
4 series · 17 of 47 positions shown, 19 images · non-contrast
Comparison: None.

CLINICAL DATA: Right arm weakness and headache.

EXAM:
CT HEAD WITHOUT CONTRAST
TECHNIQUE: Contiguous axial images were obtained from the base of the skull
through the vertex without intravenous contrast.

[Series 3: head wo · axial · 0.42mm/px · z∈[+1246,+1366]mm · 7 of 32 slices shown, 9 images]
[im 4/32  brain]
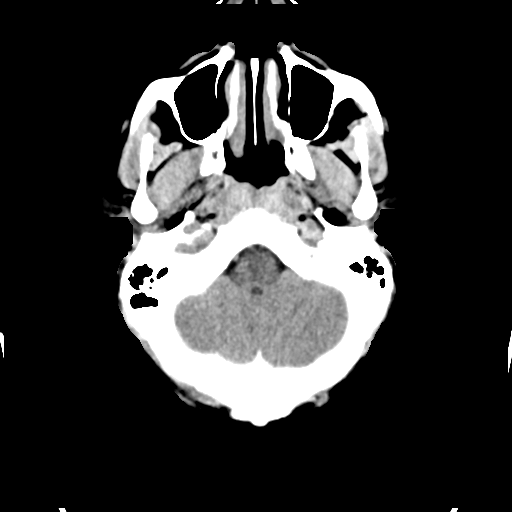
[im 4/32  bone]
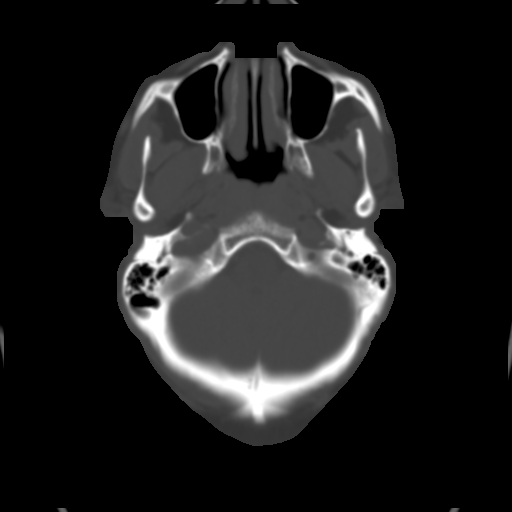
[im 8/32  brain]
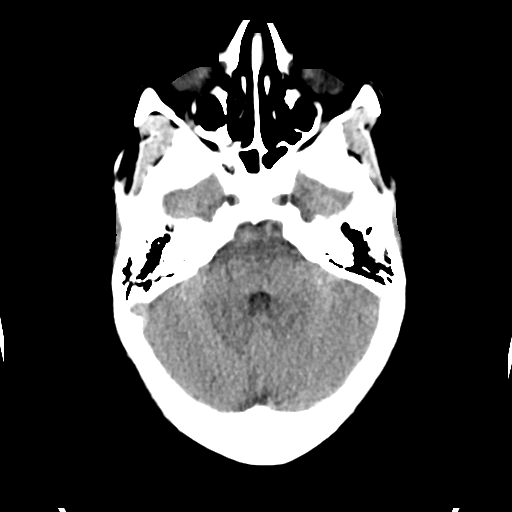
[im 12/32  brain]
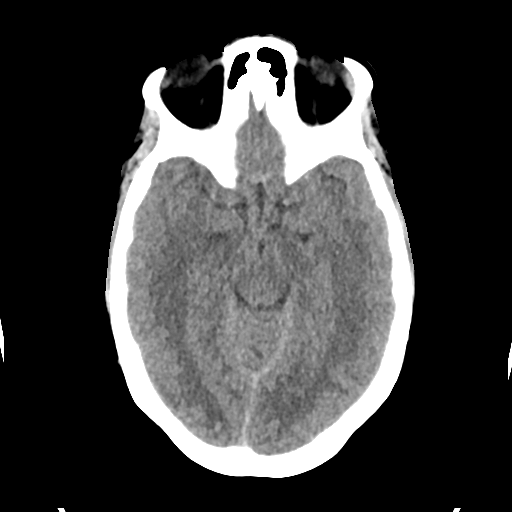
[im 16/32  brain]
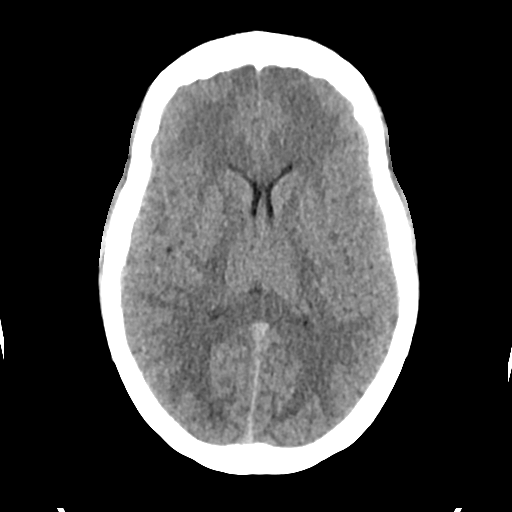
[im 20/32  brain]
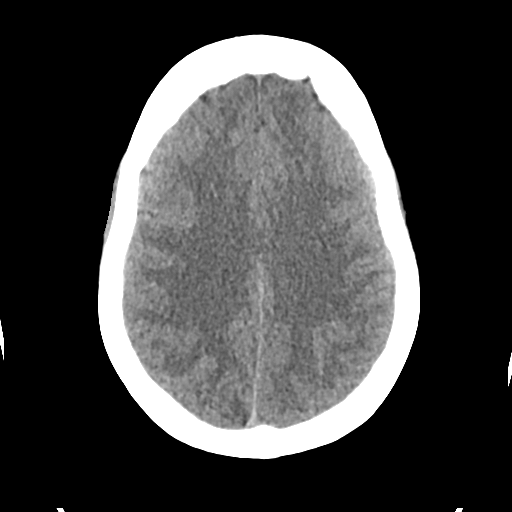
[im 20/32  bone]
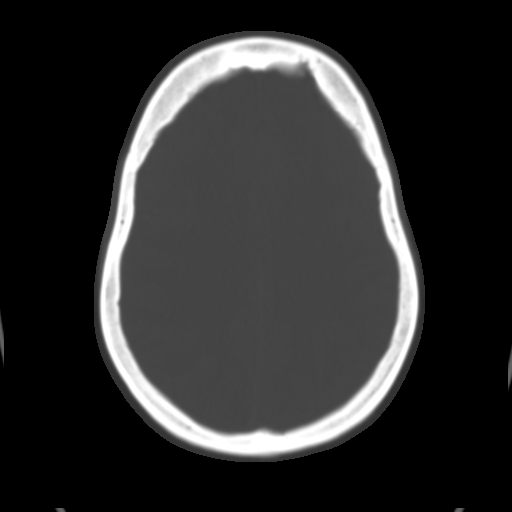
[im 24/32  brain]
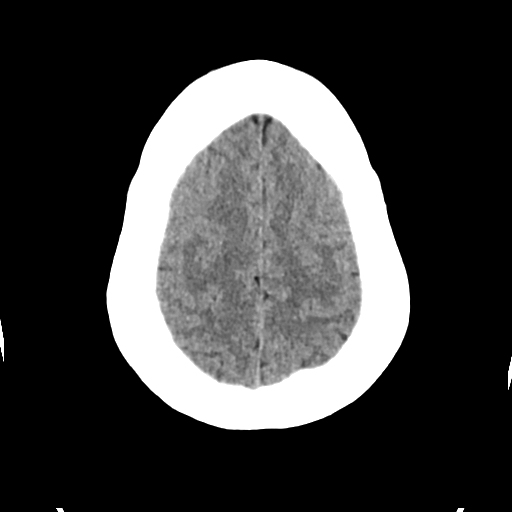
[im 28/32  brain]
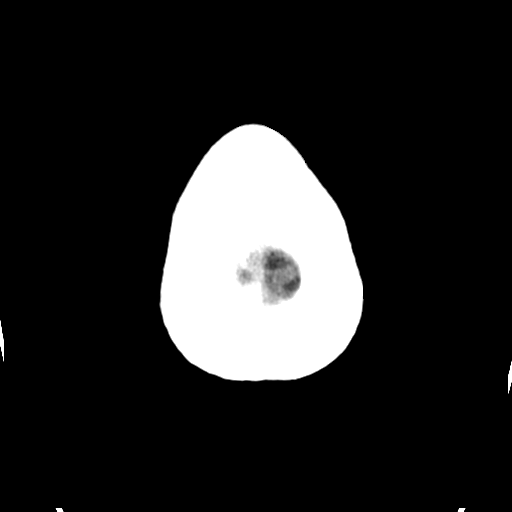

[Series 4: head bone · axial · 0.42mm/px · z∈[+1245,+1301]mm · 4 of 79 slices shown]
[im 8/79  bone]
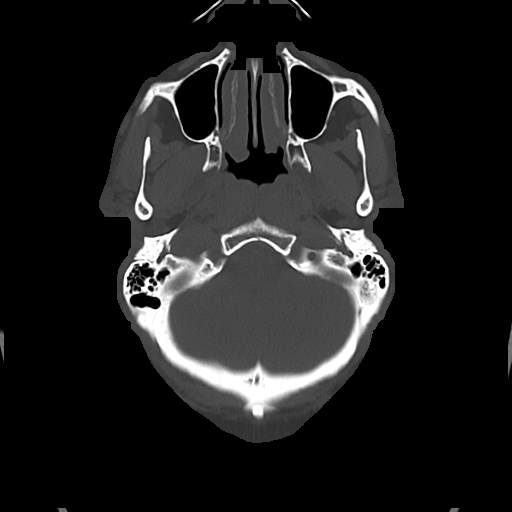
[im 16/79  bone]
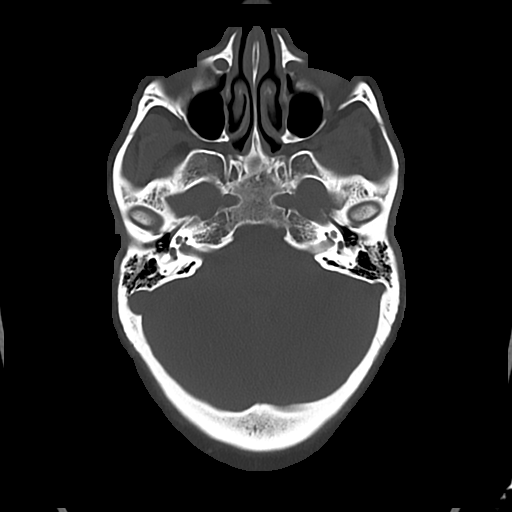
[im 24/79  bone]
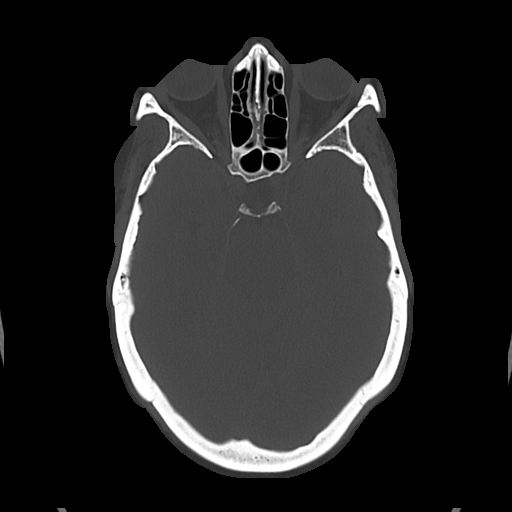
[im 36/79  bone]
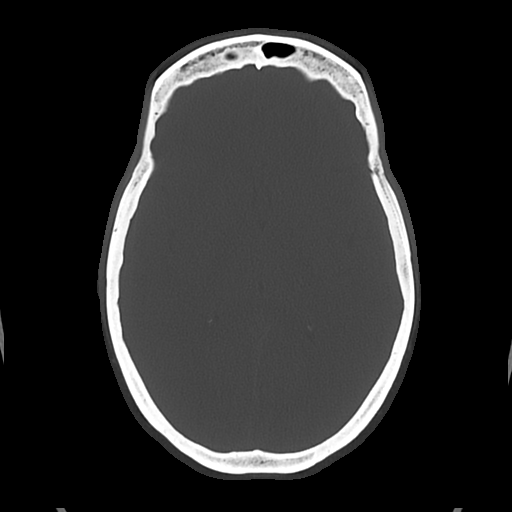

[Series 5: cor soft · coronal · 0.32mm/px · 3 of 71 slices shown]
[im 24/71  brain]
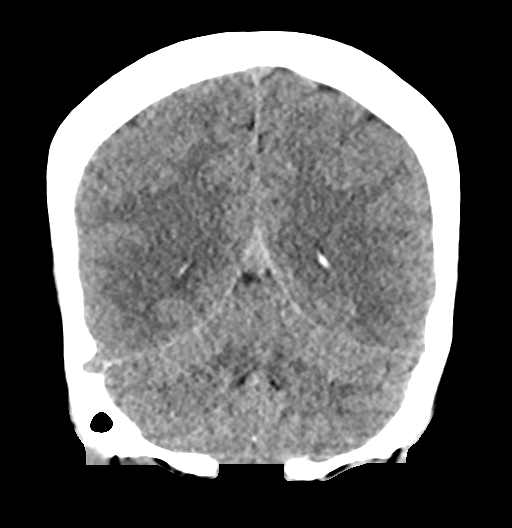
[im 32/71  brain]
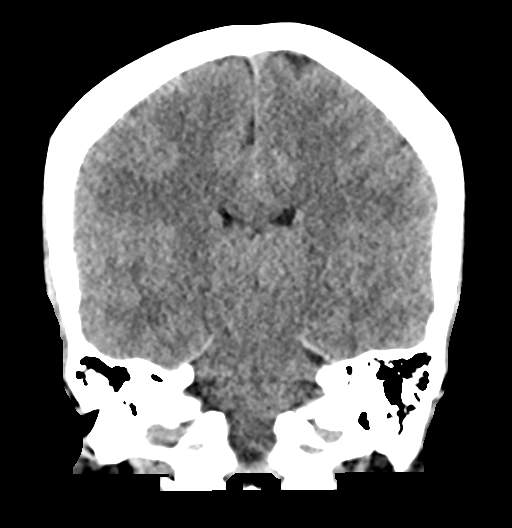
[im 39/71  brain]
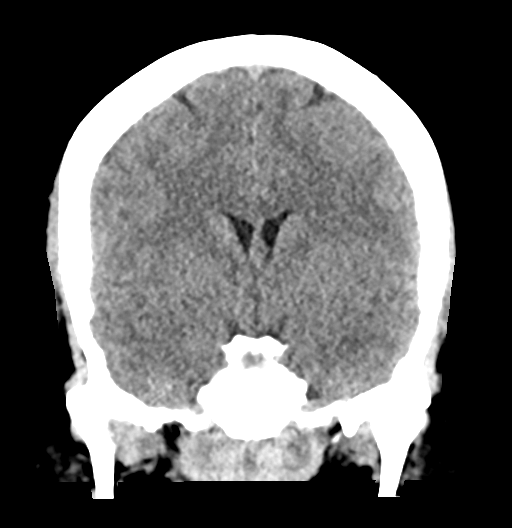

[Series 6: sag soft · sagittal · 0.33mm/px · 3 of 55 slices shown]
[im 19/55  brain]
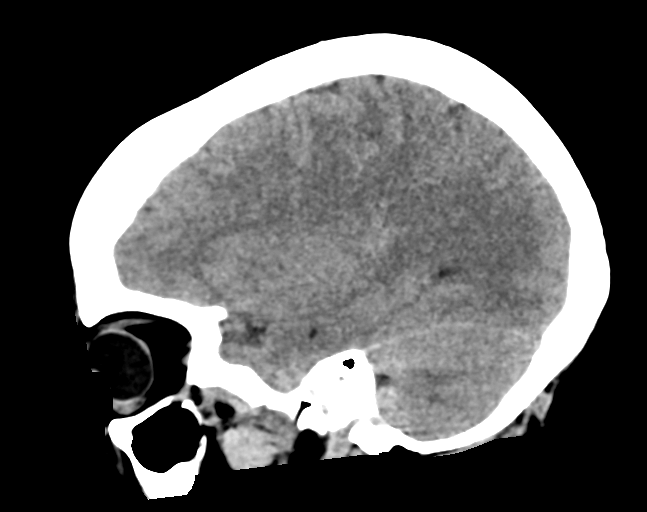
[im 28/55  brain]
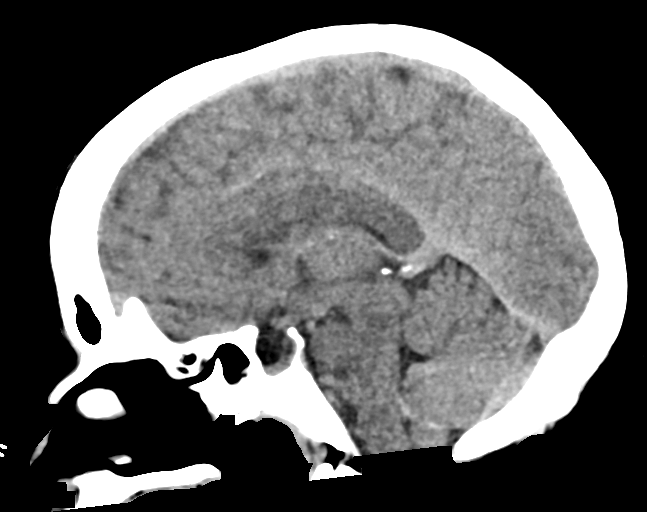
[im 37/55  brain]
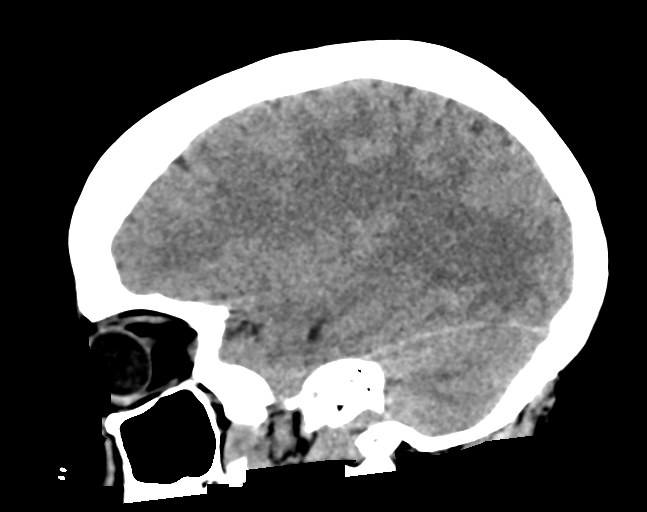

[17 of 47 positions shown; findings below may reference images not displayed]

FINDINGS: Brain: There is no evidence for acute hemorrhage, hydrocephalus,
mass lesion, or abnormal extra-axial fluid collection. No definite
CT evidence for acute infarction.

Vascular: No hyperdense vessel or unexpected calcification.

Skull: No evidence for fracture. No worrisome lytic or sclerotic
lesion.

Sinuses/Orbits: Mild mucosal thickening noted right sphenoid sinus.
Remaining paranasal sinuses and mastoid air cells are clear.
Visualized portions of the globes and intraorbital fat are
unremarkable.

Other: None.
IMPRESSION: 1. No acute intracranial abnormality.
2. Mild right sphenoid chronic sinus disease.

## 2021-01-18 IMAGING — MR MR HEAD W/ CM
2 of 5 series · 5 of 48 positions shown · IV contrast (Yes GAD)
Comparison: MRI without contrast [DATE]

CLINICAL DATA: Follow-up of earlier MRI, stroke suspected

EXAM:
MRI HEAD WITH CONTRAST
TECHNIQUE: Multiplanar, multiecho pulse sequences of the brain and surrounding
structures were obtained with intravenous contrast.
CONTRAST:  10mL GADAVIST GADOBUTROL 1 MMOL/ML IV SOLN

[Series 3: T2 post-contrast · coronal · 5.0mm · 0.20mm/px · 2 of 30 slices shown]
[im 5/30]
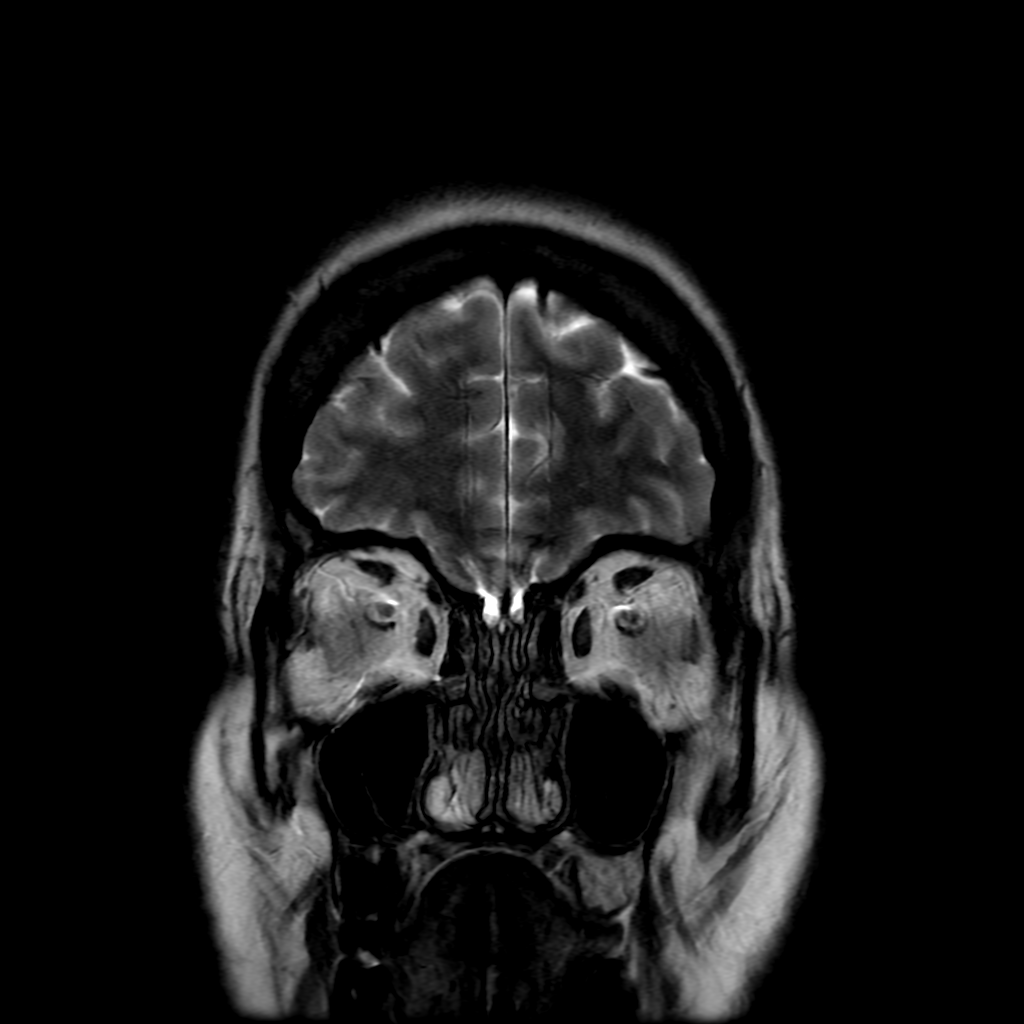
[im 15/30]
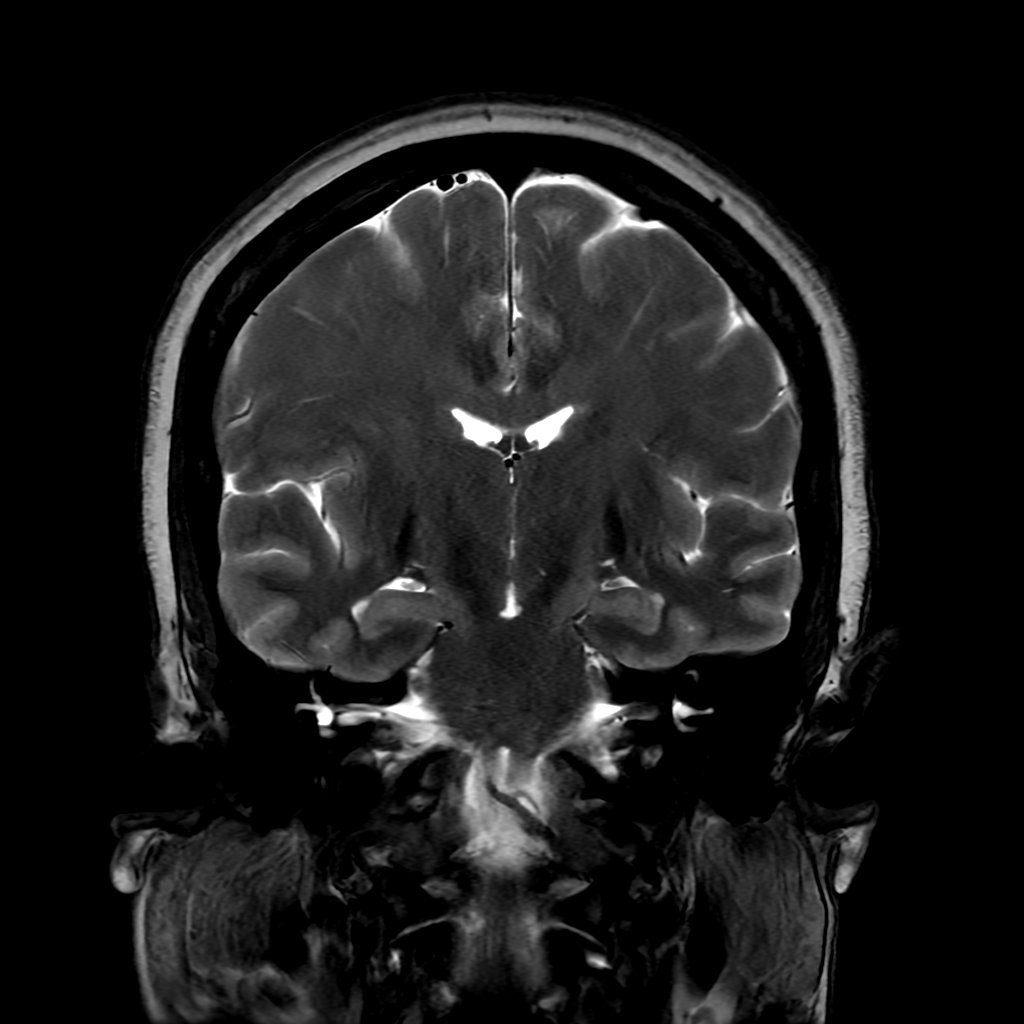

[Series 6: FLAIR post-contrast · sagittal · 5.0mm · 0.23mm/px · 3 of 24 slices shown]
[im 5/24]
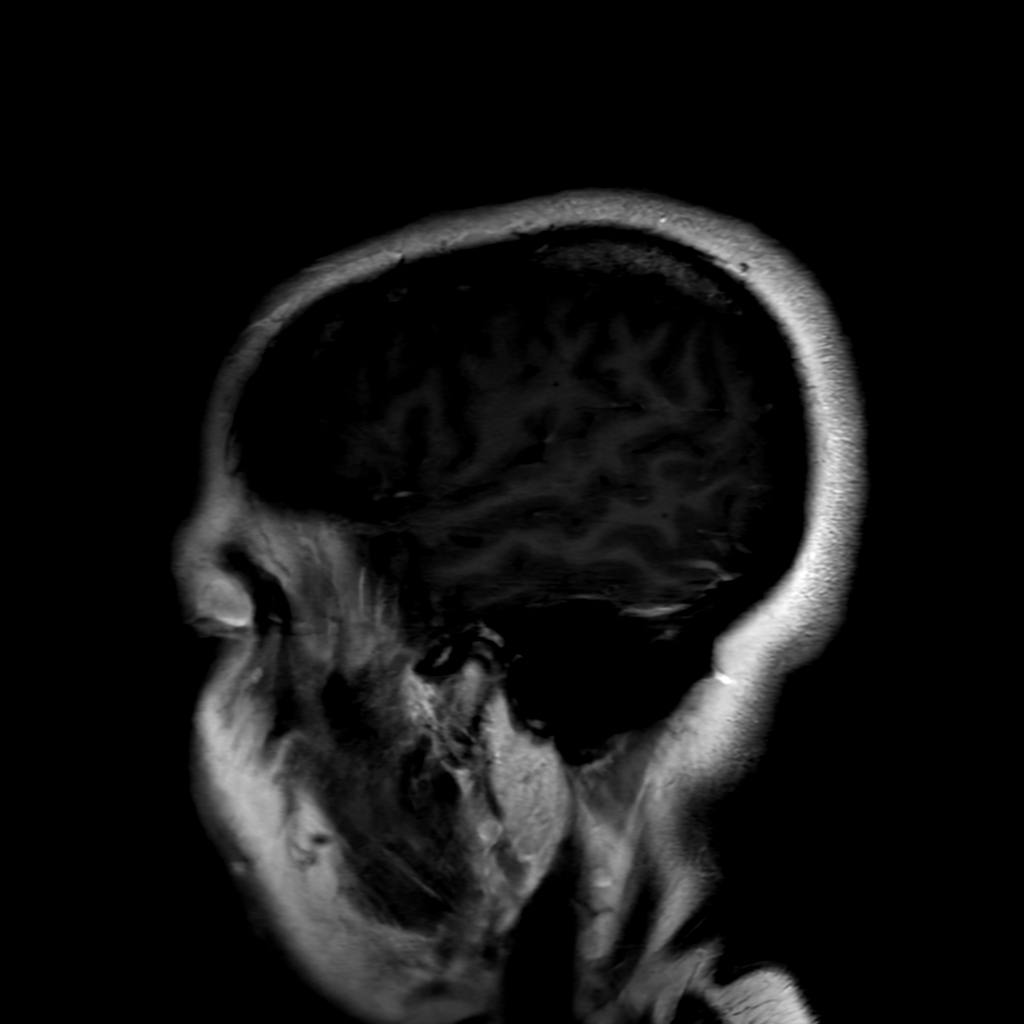
[im 14/24]
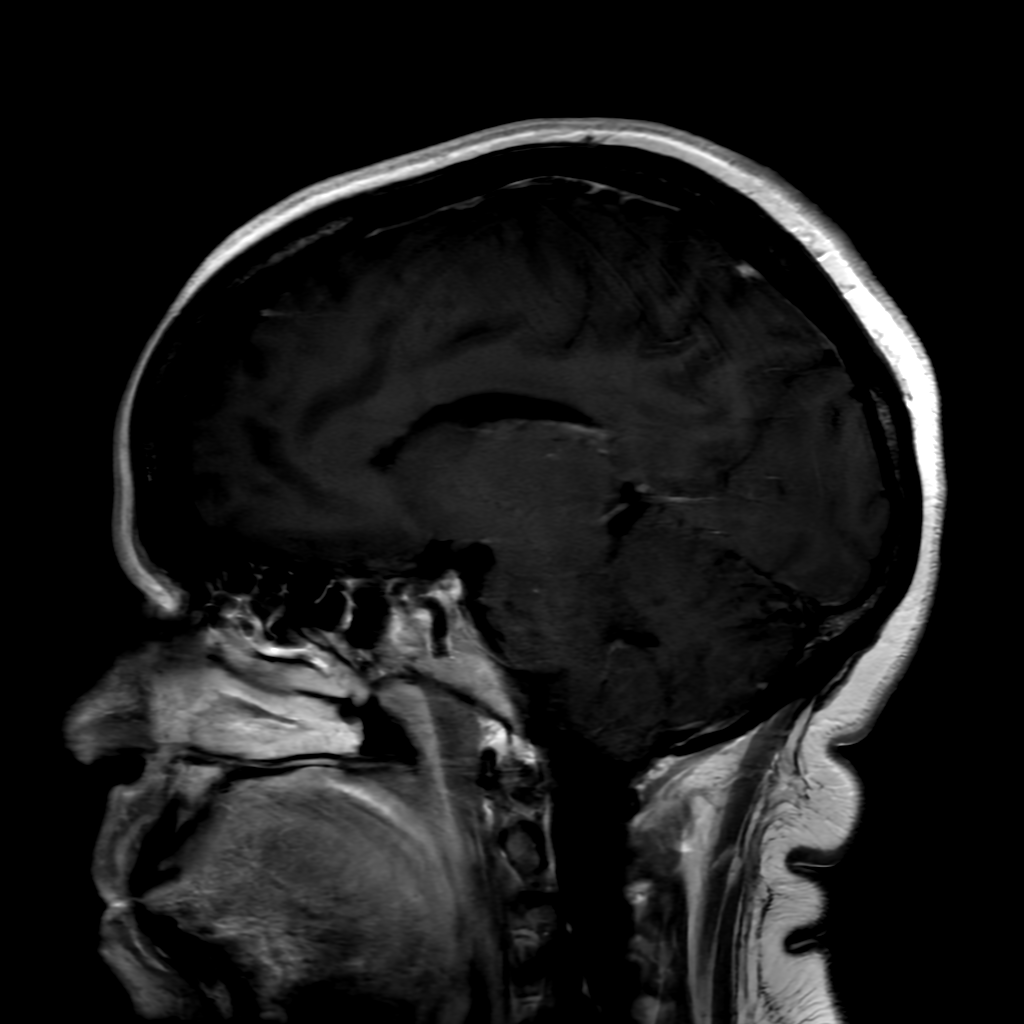
[im 24/24]
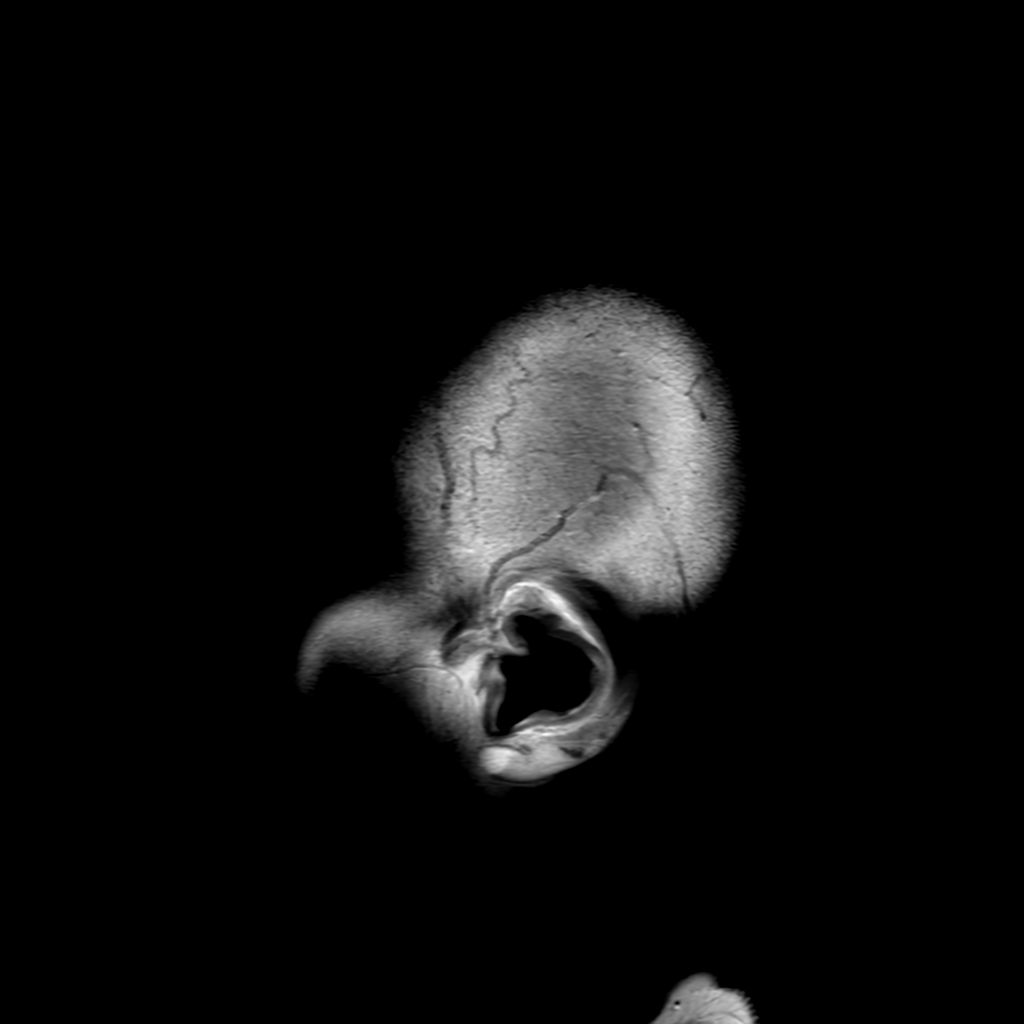

[5 of 48 positions shown; findings below may reference images not displayed]

FINDINGS: Brain: No acute hemorrhage, mass, mass effect, or midline shift. No
abnormal enhancement. No hydrocephalus or extra-axial collection. No
dural thickening.

Vascular: Patent intracranial vasculature. No evidence of venous
sinus thrombosis or stenosis.

Skull and upper cervical spine: Normal marrow signal. No abnormal
enhancement.

Sinuses/Orbits: Negative.

Other: None.
IMPRESSION: No abnormal enhancement.

## 2021-01-18 IMAGING — MR MR HEAD W/O CM
6 of 10 series · 29 of 48 positions shown · non-contrast
Comparison: CT head [DATE]

CLINICAL DATA: Acute neuro deficit.  Left arm weakness and numbness

EXAM:
MRI HEAD WITHOUT CONTRAST
TECHNIQUE: Multiplanar, multiecho pulse sequences of the brain and surrounding
structures were obtained without intravenous contrast.

[Series 2: DWI · axial · 3.0mm · 0.94mm/px · z∈[-91,+52]mm · 9 of 102 slices shown (1 of 2)]
[im 1/102]
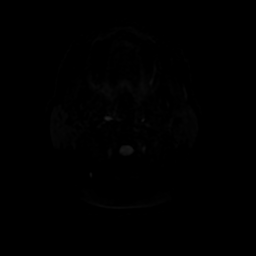
[im 13/102]
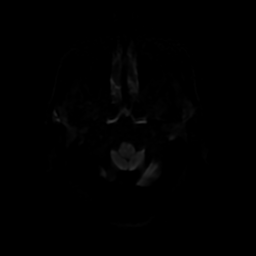
[im 26/102]
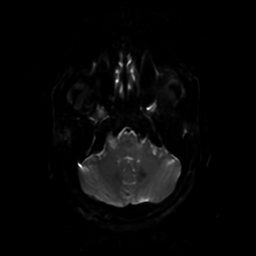
[im 38/102]
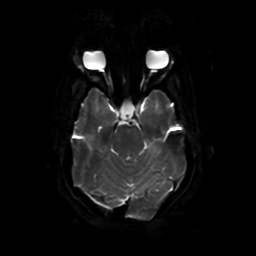
[im 51/102]
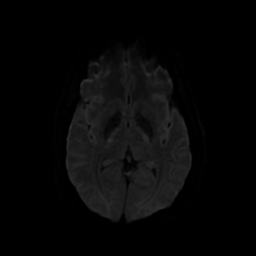
[im 64/102]
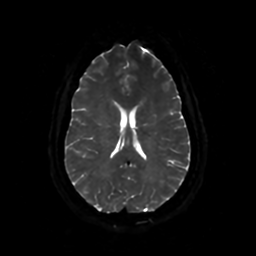
[im 76/102]
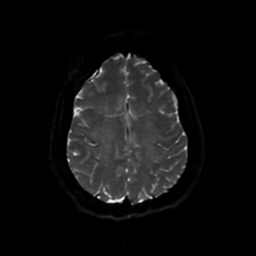
[im 89/102]
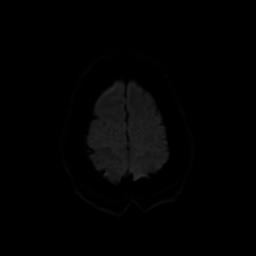
[im 102/102]
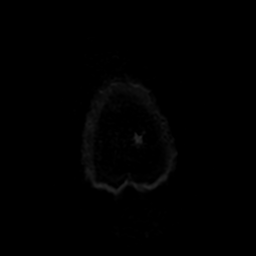

[Series 3: DWI · coronal · 4.0mm · 0.94mm/px · 7 of 74 slices shown (2 of 2)]
[im 1/74]
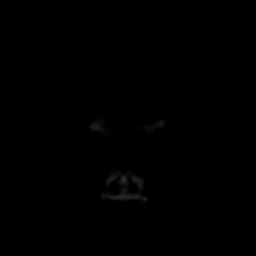
[im 13/74]
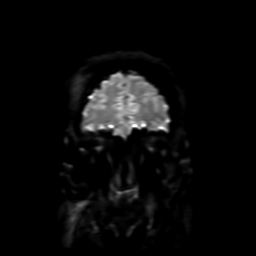
[im 25/74]
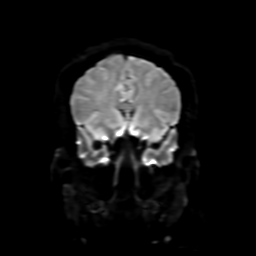
[im 37/74]
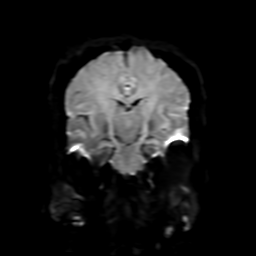
[im 49/74]
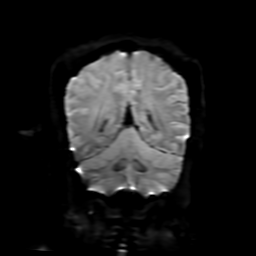
[im 61/74]
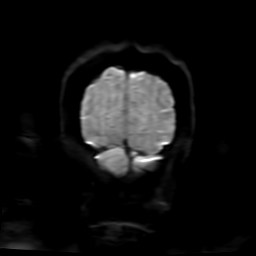
[im 74/74]
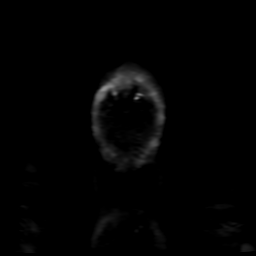

[Series 4: FLAIR · sagittal · 5.0mm · 0.23mm/px · 2 of 23 slices shown (1 of 2)]
[im 1/23]
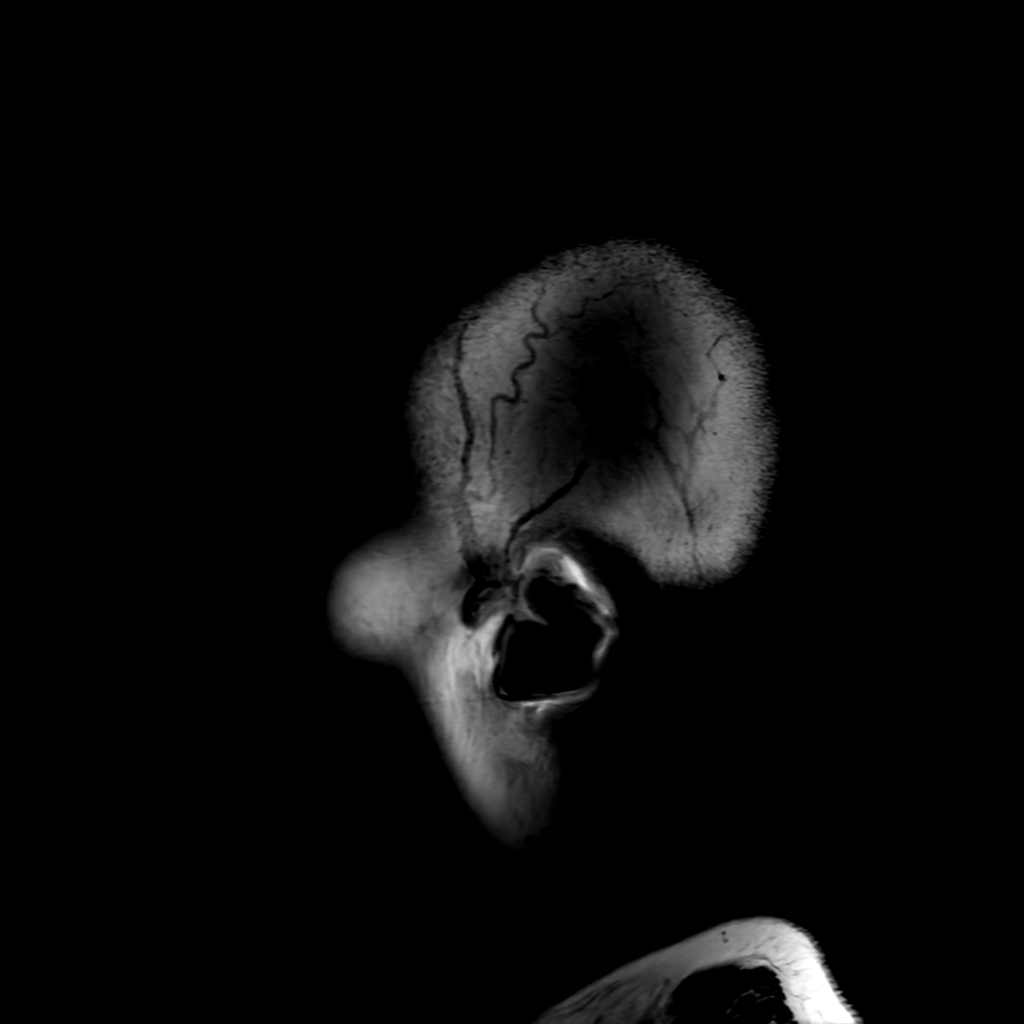
[im 23/23]
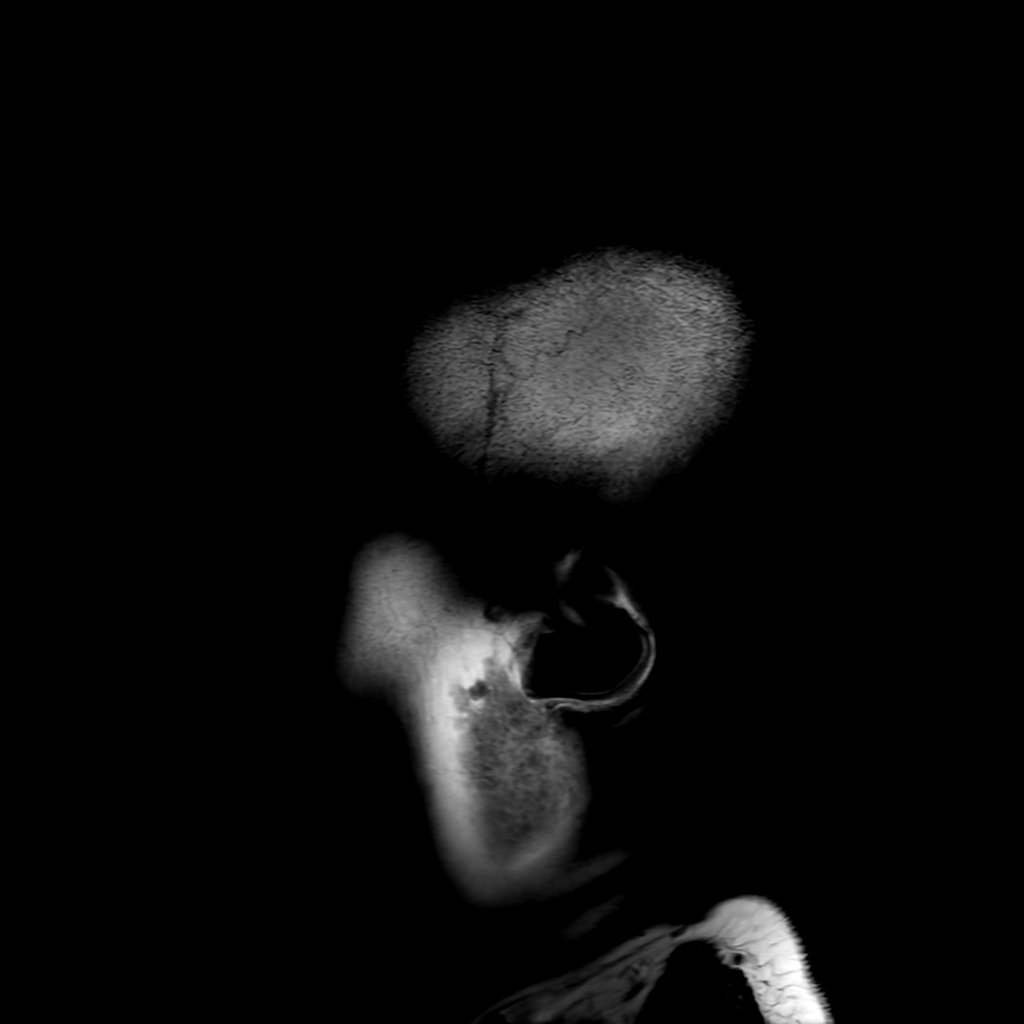

[Series 6: FLAIR · axial · 4.0mm · 0.47mm/px · z∈[-90,+53]mm · 3 of 35 slices shown (2 of 2)]
[im 1/35]
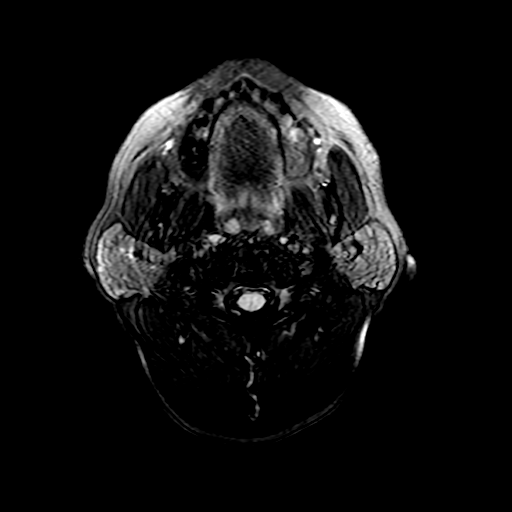
[im 18/35]
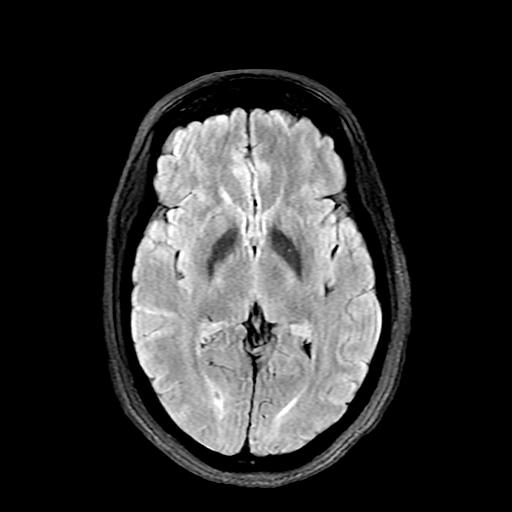
[im 35/35]
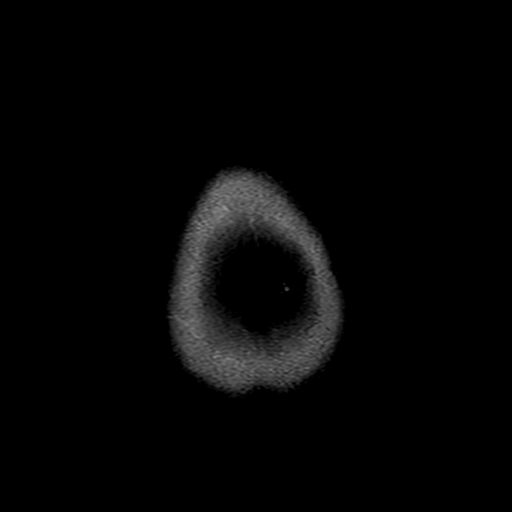

[Series 250: ADC · axial · 3.0mm · 0.94mm/px · z∈[-91,+52]mm · 5 of 51 slices shown (1 of 2)]
[im 1/51]
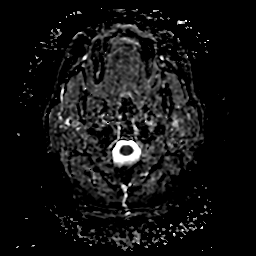
[im 13/51]
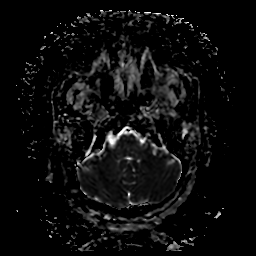
[im 26/51]
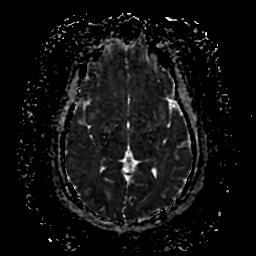
[im 38/51]
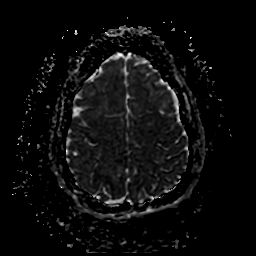
[im 51/51]
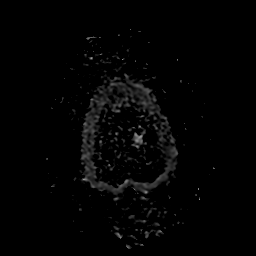

[Series 350: ADC · coronal · 4.0mm · 0.94mm/px · 3 of 37 slices shown (2 of 2)]
[im 1/37]
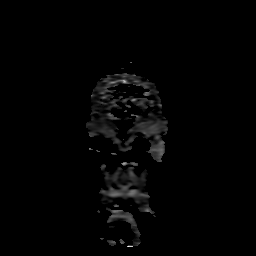
[im 19/37]
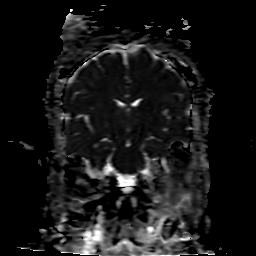
[im 37/37]
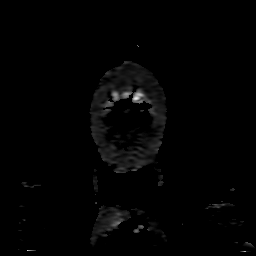

[29 of 48 positions shown; findings below may reference images not displayed]

FINDINGS: Brain: Ventricle size and cerebral volume normal. Negative for acute
infarct. Negative for hemorrhage or mass.

Subtle finding of T2 and FLAIR hyperintensity in the cortex of the
lateral temporal lobe as well as the insular cortex and hippocampus
bilaterally. This is very symmetric. No focal white matter lesion
identified.

Vascular: Normal arterial flow voids

Skull and upper cervical spine: No focal abnormality.

Sinuses/Orbits: Negative

Other: None
IMPRESSION: Negative for acute infarct

Subtle finding of possible edema in the cortex of the temporal lobes
bilaterally as well as the insular cortex and hippocampus
bilaterally. This is symmetric. Given the patient's symptoms, this
may be unrelated and may be artifactual. The patient is afebrile and
encephalitis does not appear to be likely. Further neurologic
evaluation is recommended. Consider lumbar puncture.

These results were called by telephone at the time of interpretation
on [DATE] at [DATE] to provider MELK, who
verbally acknowledged these results.

## 2021-01-18 MED ORDER — CYCLOBENZAPRINE HCL 10 MG PO TABS: 10.0000 mg | ORAL_TABLET | Freq: Two times a day (BID) | ORAL | 0 refills | Status: AC | PRN

## 2021-01-18 MED ORDER — GADOBUTROL 1 MMOL/ML IV SOLN
10.0000 mL | Freq: Once | INTRAVENOUS | Status: AC | PRN
  Administered 2021-01-18: 10 mL via INTRAVENOUS

## 2021-01-18 MED ORDER — KETOROLAC TROMETHAMINE 15 MG/ML IJ SOLN
15.0000 mg | Freq: Once | INTRAMUSCULAR | Status: AC
  Administered 2021-01-18: 15 mg via INTRAVENOUS
  Filled 2021-01-18: qty 1

## 2021-01-18 MED ORDER — LIDOCAINE 5 % EX PTCH: 1.0000 | MEDICATED_PATCH | CUTANEOUS | 0 refills | Status: AC

## 2021-01-18 MED ORDER — IBUPROFEN 800 MG PO TABS: 800.0000 mg | ORAL_TABLET | Freq: Three times a day (TID) | ORAL | 0 refills | Status: DC

## 2021-01-18 NOTE — ED Notes (Signed)
Patient transported to MRI 

## 2021-01-18 NOTE — ED Provider Notes (Signed)
Emergency Medicine Provider Triage Evaluation Note  Kristina Sellers , a 50 y.o. female  was evaluated in triage.  Pt complains of left arm pain and numbness.  Last known normal appears to be last night.  Patient woke up this morning with pain in her left hand, reports it started to feel numb throughout the day unclear when it first started feeling numb.  With EMS she was able to hold up her hands, with me she is unable to.  She reports is painful to squeeze or make a fist.  She is currently on Xarelto and has not missed any doses.  Not having any headaches or vision change, ambulatory and no upper extremity weakness.  Review of Systems  Positive: above Negative: above  Physical Exam  BP (!) 126/98 (BP Location: Right Arm)    Pulse 79    Temp 98.4 F (36.9 C) (Oral)    Resp 16    SpO2 100%  Gen:   Awake, no distress   Resp:  Normal effort  MSK:   Unable to raise left arm, flexes and extends fingers but not able/willing to squeeze. States too painful.  Radial pulse 2+ equal bilaterally, cap refill less than 2 Other:  Cranial nerves III through XII are grossly intact.  Lower extremity strength equal bilaterally.  Grip strength to the left arm weaker compared to right, suspected due to poor effort.  Medical Decision Making  Medically screening exam initiated at 12:22 PM.  Appropriate orders placed.  Marjean Imperato was informed that the remainder of the evaluation will be completed by another provider, this initial triage assessment does not replace that evaluation, and the importance of remaining in the ED until their evaluation is complete.  Not a code stroke, will initiate neuro work-up.   Theron Arista, PA-C 01/18/21 1224    Rozelle Logan, DO 01/24/21 1146

## 2021-01-18 NOTE — ED Provider Notes (Signed)
[facility] EMERGENCY DEPARTMENT Provider Note   CSN: [id number] Arrival date & time: 01/18/21  1202     History No chief complaint on file.   Kristina Sellers is a 50 y.o. female.  The history is provided by the patient and medical records.  Illness Location:  L arm Quality:  Weakness Severity:  Moderate Onset quality:  Gradual Duration:  1 day Timing:  Constant Progression:  Worsening Chronicity:  New Context:  Started with a cramping pain in her left hand that progressed to numbness and weakness extending up her entire left arm Worsened by:  None Ineffective treatments:  None tried Associated symptoms: no cough, no ear pain, no fever, no rash, no sore throat and no vomiting       Past Medical History:  Diagnosis Date   Hypertension     Patient Active Problem List   Diagnosis Date Noted   Dyspnea on exertion 11/01/2019   Pulmonary embolism ([place]) 09/15/2019   Pulmonary nodule 09/15/2019   Asthma 09/15/2019    Past Surgical History:  Procedure Laterality Date   ABDOMINAL HYSTERECTOMY     BREAST SURGERY       OB History   No obstetric history on file.     Family History  Adopted: Yes    Social History   Tobacco Use   Smoking status: Never   Smokeless tobacco: Never  Vaping Use   Vaping Use: Never used  Substance Use Topics   Alcohol use: Never   Drug use: Never    Home Medications Prior to Admission medications   Medication Sig Start Date End Date Taking? Authorizing Provider  acetaminophen (TYLENOL) 500 MG tablet Take 1,000 mg by mouth every 6 (six) hours as needed for moderate pain, mild pain or headache.   Yes [provider]  albuterol (VENTOLIN HFA) 108 (90 Base) MCG/ACT inhaler Inhale 2 puffs into the lungs every 6 (six) hours as needed for wheezing or shortness of breath.   Yes [provider]  cyclobenzaprine (FLEXERIL) 10 MG tablet Take 1 tablet (10 mg total) by mouth 2 (two) times daily as needed for  muscle spasms. 01/18/21  Yes Verne Lanuza, Burnadette Peter, MD  fluticasone (FLONASE) 50 MCG/ACT nasal spray Place 1 spray into both nostrils daily. 01/07/20  Yes Khatri, Hina, PA-C  hydrochlorothiazide (HYDRODIURIL) 25 MG tablet Take 25 mg by mouth every morning. 07/05/19  Yes [provider]  lidocaine (LIDODERM) 5 % Place 1 patch onto the skin daily. Remove & Discard patch within 12 hours or as directed by MD 01/18/21  Yes Shaquavia Whisonant, Burnadette Peter, MD  linaclotide (LINZESS) 290 MCG CAPS capsule Take 290 mcg by mouth daily as needed (dizziness).   Yes [provider]  losartan (COZAAR) 50 MG tablet Take 50 mg by mouth daily. 12/05/20  Yes [provider]  RIVAROXABAN Alveda Reasons) VTE STARTER PACK (15 & 20 MG TABLETS) Follow package directions: Take one 15mg  tablet by mouth twice a day. On day 22, switch to one 20mg  tablet once a day. Take with food. 07/15/19  Yes Nuala Alpha A, PA-C  triamcinolone cream (KENALOG) 0.1 % Apply 1 application topically as needed for dry skin. 11/07/19  Yes [provider]  amLODipine (NORVASC) 10 MG tablet Take 10 mg by mouth daily. Patient not taking: Reported on 01/18/2021 07/05/19   [provider]  ondansetron (ZOFRAN ODT) 4 MG disintegrating tablet Take 1 tablet (4 mg total) by mouth every 8 (eight) hours as needed for nausea or vomiting. Patient  not taking: Reported on 01/18/2021 12/05/20   Godfrey Pick, MD  sodium chloride (OCEAN) 0.65 % SOLN nasal spray Place 1 spray into both nostrils daily as needed for congestion. Patient not taking: Reported on 01/18/2021    [provider]    Allergies    Other, Latex, and Penicillins  Review of Systems   Review of Systems  Constitutional:  Negative for chills and fever.  HENT:  Negative for ear pain and sore throat.   Eyes:  Negative for pain and visual disturbance.  Respiratory:  Negative for cough.   Cardiovascular:  Negative for palpitations.  Gastrointestinal:  Negative for vomiting.   Genitourinary:  Negative for dysuria and hematuria.  Musculoskeletal:  Negative for arthralgias and back pain.  Skin:  Negative for color change and rash.  Neurological:  Positive for weakness. Negative for seizures and syncope.  All other systems reviewed and are negative.  Physical Exam Updated Vital Signs BP (!) 157/86 (BP Location: Right Arm)    Pulse 69    Temp 98 F (36.7 C)    Resp 18    SpO2 100%   Physical Exam Vitals and nursing note reviewed.  Constitutional:      General: She is not in acute distress.    Appearance: Normal appearance. She is well-developed.  HENT:     Head: Normocephalic and atraumatic.     Right Ear: External ear normal.     Left Ear: External ear normal.     Nose: Nose normal. No congestion or rhinorrhea.     Mouth/Throat:     Mouth: Mucous membranes are moist.  Eyes:     Extraocular Movements: Extraocular movements intact.     Conjunctiva/sclera: Conjunctivae normal.     Pupils: Pupils are equal, round, and reactive to light.  Cardiovascular:     Rate and Rhythm: Normal rate and regular rhythm.     Pulses: Normal pulses.     Heart sounds: No murmur heard. Pulmonary:     Effort: Pulmonary effort is normal. No respiratory distress.     Breath sounds: Normal breath sounds. No wheezing, rhonchi or rales.  Abdominal:     General: Abdomen is flat. Bowel sounds are normal.     Palpations: Abdomen is soft.     Tenderness: There is no abdominal tenderness. There is no guarding or rebound.  Musculoskeletal:        General: No swelling, tenderness or deformity.     Cervical back: Normal range of motion and neck supple. No rigidity.  Skin:    General: Skin is warm and dry.     Capillary Refill: Capillary refill takes less than 2 seconds.  Neurological:     General: No focal deficit present.     Mental Status: She is alert and oriented to person, place, and time.     Motor: Weakness (Left arm 1/5 weakness on flexion and grip strength testing  compared to 5 out of 5 strength on the right.  Patient does have poor effort on strength testing.  Subjective tingling down left arm.) present.  Psychiatric:        Mood and Affect: Mood normal.    ED Results / Procedures / Treatments   Labs (all labs ordered are listed, but only abnormal results are displayed) Labs Reviewed  PROTIME-INR - Abnormal; Notable for the following components:      Result Value   Prothrombin Time 19.0 (*)    INR 1.6 (*)    All other components  within normal limits  CBC - Abnormal; Notable for the following components:   WBC 11.3 (*)    All other components within normal limits  COMPREHENSIVE METABOLIC PANEL - Abnormal; Notable for the following components:   Potassium 3.4 (*)    Glucose, Bld 102 (*)    All other components within normal limits  RAPID URINE DRUG SCREEN, HOSP PERFORMED - Abnormal; Notable for the following components:   Tetrahydrocannabinol POSITIVE (*)    All other components within normal limits  RESP PANEL BY RT-PCR (FLU A&B, COVID) ARPGX2  ETHANOL  APTT  DIFFERENTIAL  URINALYSIS, ROUTINE W REFLEX MICROSCOPIC  I-STAT CHEM 8, ED  I-STAT BETA HCG BLOOD, ED ([facility], [facility], [facility] ONLY)    EKG EKG Interpretation  Date/Time:  Friday January 18 2021 12:25:18 EST Ventricular Rate:  68 PR Interval:  142 QRS Duration: 82 QT Interval:  406 QTC Calculation: 431 R Axis:   4 Text Interpretation: Normal sinus rhythm with sinus arrhythmia Minimal voltage criteria for LVH, may be normal variant ( R in aVL ) T wave abnormality, consider inferior ischemia Abnormal ECG since last tracing no significant change Confirmed by Malvin Johns [phone]) on 01/18/2021 3:56:45 PM  Radiology CT HEAD WO CONTRAST  Result Date: 01/18/2021 CLINICAL DATA:  Right arm weakness and headache. EXAM: CT HEAD WITHOUT CONTRAST TECHNIQUE: Contiguous axial images were obtained from the base of the skull through the vertex without intravenous contrast. COMPARISON:  None.  FINDINGS: Brain: There is no evidence for acute hemorrhage, hydrocephalus, mass lesion, or abnormal extra-axial fluid collection. No definite CT evidence for acute infarction. Vascular: No hyperdense vessel or unexpected calcification. Skull: No evidence for fracture. No worrisome lytic or sclerotic lesion. Sinuses/Orbits: Mild mucosal thickening noted right sphenoid sinus. Remaining paranasal sinuses and mastoid air cells are clear. Visualized portions of the globes and intraorbital fat are unremarkable. Other: None. IMPRESSION: 1. No acute intracranial abnormality. 2. Mild right sphenoid chronic sinus disease. Electronically Signed   By: Misty Stanley M.D.   On: 01/18/2021 13:48   MR BRAIN WO CONTRAST  Result Date: 01/18/2021 CLINICAL DATA:  Acute neuro deficit.  Left arm weakness and numbness EXAM: MRI HEAD WITHOUT CONTRAST TECHNIQUE: Multiplanar, multiecho pulse sequences of the brain and surrounding structures were obtained without intravenous contrast. COMPARISON:  CT head 01/18/2021 FINDINGS: Brain: Ventricle size and cerebral volume normal. Negative for acute infarct. Negative for hemorrhage or mass. Subtle finding of T2 and FLAIR hyperintensity in the cortex of the lateral temporal lobe as well as the insular cortex and hippocampus bilaterally. This is very symmetric. No focal white matter lesion identified. Vascular: Normal arterial flow voids Skull and upper cervical spine: No focal abnormality. Sinuses/Orbits: Negative Other: None IMPRESSION: Negative for acute infarct Subtle finding of possible edema in the cortex of the temporal lobes bilaterally as well as the insular cortex and hippocampus bilaterally. This is symmetric. Given the patient's symptoms, this may be unrelated and may be artifactual. The patient is afebrile and encephalitis does not appear to be likely. Further neurologic evaluation is recommended. Consider lumbar puncture. These results were called by telephone at the time of  interpretation on 01/18/2021 at 6:30 Pm to provider Idamae Lusher MD, who verbally acknowledged these results. Electronically Signed   By: Franchot Gallo M.D.   On: 01/18/2021 19:17   MR BRAIN W CONTRAST  Result Date: 01/18/2021 CLINICAL DATA:  Follow-up of earlier MRI, stroke suspected EXAM: MRI HEAD WITH CONTRAST TECHNIQUE: Multiplanar, multiecho pulse sequences of the brain and  surrounding structures were obtained with intravenous contrast. CONTRAST:  38mL GADAVIST GADOBUTROL 1 MMOL/ML IV SOLN COMPARISON:  MRI without contrast 01/18/2021 FINDINGS: Brain: No acute hemorrhage, mass, mass effect, or midline shift. No abnormal enhancement. No hydrocephalus or extra-axial collection. No dural thickening. Vascular: Patent intracranial vasculature. No evidence of venous sinus thrombosis or stenosis. Skull and upper cervical spine: Normal marrow signal. No abnormal enhancement. Sinuses/Orbits: Negative. Other: None. IMPRESSION: No abnormal enhancement. Electronically Signed   By: Wiliam Ke M.D.   On: 01/18/2021 22:01   MR Cervical Spine Wo Contrast  Result Date: 01/18/2021 CLINICAL DATA:  Acute myelopathy. Left arm weakness and numbness since today. EXAM: MRI CERVICAL SPINE WITHOUT CONTRAST TECHNIQUE: Multiplanar, multisequence MR imaging of the cervical spine was performed. No intravenous contrast was administered. COMPARISON:  None. FINDINGS: Alignment: Mild retrolisthesis C4-5 and C5-6. Mild cervical kyphosis. Vertebrae: Negative for fracture or mass Cord: Cord evaluation limited by mild motion. Allowing for this, no cord signal abnormality. Posterior Fossa, vertebral arteries, paraspinal tissues: Negative Disc levels: C2-3: Negative C3-4: Disc degeneration and spurring.  No significant stenosis. C4-5: Disc degeneration and diffuse uncinate spurring left greater than right. Moderate to severe left foraminal encroachment. Right foramen patent. C5-6: Disc degeneration and diffuse uncinate spurring.  Moderate to severe left foraminal encroachment. Moderate right foraminal encroachment. Cord flattening with mild spinal stenosis C6-7: Disc degeneration with diffuse uncinate spurring. Moderate to severe foraminal encroachment bilaterally C7-T1: Negative for stenosis IMPRESSION: Multilevel cervical spondylosis. Bilateral foraminal encroachment due to spurring as described above. Electronically Signed   By: Marlan Palau M.D.   On: 01/18/2021 18:27    Procedures Procedures   Medications Ordered in ED Medications  ketorolac (TORADOL) 15 MG/ML injection 15 mg (15 mg Intravenous Given 01/18/21 1612)  gadobutrol (GADAVIST) 1 MMOL/ML injection 10 mL (10 mLs Intravenous Contrast Given 01/18/21 2138)    ED Course  I have reviewed the triage vital signs and the nursing notes.  Pertinent labs & imaging results that were available during my care of the patient were reviewed by me and considered in my medical decision making (see chart for details).    MDM Rules/Calculators/A&P                          50 year old female presenting with atraumatic left arm pain and weakness as above.  She is afebrile and hemodynamically stable.  Exam did reveal left arm weakness, but there are concerns for poor effort from the patient.  She denies IV drug use.  No recent fevers or night sweats.  White count normal.  Low concern for epidural abscess.  She is on Xarelto for history of PE.  There are concerns for possible epidural hematoma or other spinal cord compression.  CT head showed no acute intracranial findings.  MRI head and C-spine obtained.  Patient does have some degenerative changes in her C-spine with spurring that may explain radicular symptoms down her left arm.  MRI of the brain without contrast did show some questionable subtle edema involving her bilateral temporal lobes that is symmetric.  Possibly artifact versus motion, but HSV encephalitis on the differential.  Discussed with neurology.  MRI brain with  contrast obtained.  No acute abnormalities or uptake noted.  Patient reassessed.  She has no meningeal signs on exam.  She is afebrile.  Clinical picture is not consistent with encephalitis.  Patient does admit to recent stressors and is tearful in the room.  Presenting symptoms may be  related to cervical radiculopathy versus functional disorder.  Her weakness has improved since arriving.  MRIs as above.  No concern for spinal cord impingement.  Patient appropriate for discharge home with close follow-up with PCP for reassessment.  Will prescribe anti-inflammatories, muscle relaxants, lidocaine patches to help with symptoms.  Strict return to ED precautions provided.   Final Clinical Impression(s) / ED Diagnoses Final diagnoses:  Left arm pain  Left arm weakness    Rx / DC Orders ED Discharge Orders          Ordered    cyclobenzaprine (FLEXERIL) 10 MG tablet  2 times daily PRN        01/18/21 2310    lidocaine (LIDODERM) 5 %  Every 24 hours        01/18/21 2310    ibuprofen (ADVIL) 800 MG tablet  3 times daily,   Status:  Discontinued        01/18/21 2310             Idamae Lusher, MD 01/18/21 2314    Malvin Johns, MD 01/18/21 2326

## 2021-01-18 NOTE — ED Triage Notes (Signed)
Patient BIB [facility] with complaint of left arm pain and numbness, patient unsure of when symptom started. Patient states pain was present when she woke but as day has progressed she states numbness has gotten worse. EMS reports that patient was unable to left left arm until cell phone rang at which point patient picked up and answered her device with the left arm.  When assessing arm drift patient holds left arm at same level as right for 3-5 seconds and then suddenly has no effort against gravity in left arm. No facial droop, speech is clear, denies vision changes, is alert, oriented, and in no apparent distress at this time.

## 2021-06-20 ENCOUNTER — Emergency Department (HOSPITAL_COMMUNITY)
Admission: EM | Admit: 2021-06-20 | Discharge: 2021-06-20 | Disposition: A | Discharge status: Home / Self Care | Payer: BC Managed Care – PPO | Attending: Emergency Medicine | Admitting: Emergency Medicine

## 2021-06-20 ENCOUNTER — Other Ambulatory Visit: Payer: Self-pay

## 2021-06-20 ENCOUNTER — Encounter (HOSPITAL_COMMUNITY): Payer: Self-pay | Admitting: Oncology

## 2021-06-20 DIAGNOSIS — Z7901 Long term (current) use of anticoagulants: Secondary | ICD-10-CM | POA: Diagnosis not present

## 2021-06-20 DIAGNOSIS — Z91040 Latex allergy status: Secondary | ICD-10-CM | POA: Diagnosis not present

## 2021-06-20 DIAGNOSIS — H1031 Unspecified acute conjunctivitis, right eye: Secondary | ICD-10-CM | POA: Diagnosis not present

## 2021-06-20 DIAGNOSIS — H579 Unspecified disorder of eye and adnexa: Secondary | ICD-10-CM | POA: Diagnosis present

## 2021-06-20 MED ORDER — ERYTHROMYCIN 5 MG/GM OP OINT: TOPICAL_OINTMENT | OPHTHALMIC | 0 refills | Status: AC

## 2021-06-20 NOTE — ED Triage Notes (Signed)
Pt c/o right eye drainage and redness that began yesterday. Redness noted.

## 2021-06-20 NOTE — ED Provider Notes (Signed)
[facility]-EMERGENCY DEPT Provider Note   CSN: [id number] Arrival date & time: 06/20/21  0856     History  Chief Complaint  Patient presents with   Eye Drainage    Kristina Sellers is a 51 y.o. female.  HPI Patient works at a daycare.  Yesterday after work she started noting some itching and redness in her right eye.  This morning the eye is very red and there was crusting and drainage.  Patient denies any change in vision.  The eye is itchy.  Not painful.  No fever no chills or associated symptoms.    Home Medications Prior to Admission medications   Medication Sig Start Date End Date Taking? Authorizing Provider  erythromycin ophthalmic ointment Place a 1/2 inch ribbon of ointment into the lower eyelid 4-6 times daily for 5-7 days. 06/20/21  Yes Arby Barrette, MD  acetaminophen (TYLENOL) 500 MG tablet Take 1,000 mg by mouth every 6 (six) hours as needed for moderate pain, mild pain or headache.    [provider]  albuterol (VENTOLIN HFA) 108 (90 Base) MCG/ACT inhaler Inhale 2 puffs into the lungs every 6 (six) hours as needed for wheezing or shortness of breath.    [provider]  amLODipine (NORVASC) 10 MG tablet Take 10 mg by mouth daily. Patient not taking: Reported on 01/18/2021 07/05/19   [provider]  cyclobenzaprine (FLEXERIL) 10 MG tablet Take 1 tablet (10 mg total) by mouth 2 (two) times daily as needed for muscle spasms. 01/18/21   Lutricia Feil, MD  fluticasone (FLONASE) 50 MCG/ACT nasal spray Place 1 spray into both nostrils daily. 01/07/20   Khatri, Hina, PA-C  hydrochlorothiazide (HYDRODIURIL) 25 MG tablet Take 25 mg by mouth every morning. 07/05/19   [provider]  lidocaine (LIDODERM) 5 % Place 1 patch onto the skin daily. Remove & Discard patch within 12 hours or as directed by MD 01/18/21   Lutricia Feil, MD  linaclotide (LINZESS) 290 MCG CAPS capsule Take 290 mcg by mouth daily as needed (dizziness).     [provider]  losartan (COZAAR) 50 MG tablet Take 50 mg by mouth daily. 12/05/20   [provider]  ondansetron (ZOFRAN ODT) 4 MG disintegrating tablet Take 1 tablet (4 mg total) by mouth every 8 (eight) hours as needed for nausea or vomiting. Patient not taking: Reported on 01/18/2021 12/05/20   Gloris Manchester, MD  RIVAROXABAN Carlena Hurl) VTE STARTER PACK (15 & 20 MG TABLETS) Follow package directions: Take one 15mg  tablet by mouth twice a day. On day 22, switch to one 20mg  tablet once a day. Take with food. 07/15/19   A, PA-C  sodium chloride (OCEAN) 0.65 % SOLN nasal spray Place 1 spray into both nostrils daily as needed for congestion. Patient not taking: Reported on 01/18/2021    [provider]  triamcinolone cream (KENALOG) 0.1 % Apply 1 application topically as needed for dry skin. 11/07/19   [provider]      Allergies    Other, Latex, and Penicillins    Review of Systems   Review of Systems Constitutional: No fever no chills no malaise Neurologic: No headache no confusion Physical Exam Updated Vital Signs BP (!) 156/98 (BP Location: Left Arm)   Pulse 63   Temp 98.2 F (36.8 C) (Oral)   Resp 18   Ht 5\' 2"  (1.575 m)   Wt 104.8 kg   SpO2 99%   BMI 42.25 kg/m  Physical Exam Constitutional:  Comments: Alert nontoxic and well in appearance.  Eyes:     Comments: Diffuse scleral injection on the right.  Left is normal.  Ocular motions normal.  See attached images.  Pulmonary:     Effort: Pulmonary effort is normal.  Neurological:     General: No focal deficit present.     Mental Status: She is oriented to person, place, and time.  Psychiatric:        Mood and Affect: Mood normal.     ED Results / Procedures / Treatments   Labs (all labs ordered are listed, but only abnormal results are displayed) Labs Reviewed - No data to display  EKG None  Radiology No results found.  Procedures Procedures     Medications Ordered in ED Medications - No data to display  ED Course/ Medical Decision Making/ A&P                           Medical Decision Making Risk Prescription drug management.   Patient presents with head eye.  Itching but no pain.  No visual changes.  Patient does not wear contact lenses.  She is otherwise well.  Patient works in a daycare setting.  Significant risk for bacterial conjunctivitis.  Will scribed erythromycin ointment.        Final Clinical Impression(s) / ED Diagnoses Final diagnoses:  Acute bacterial conjunctivitis of right eye    Rx / DC Orders ED Discharge Orders          Ordered    erythromycin ophthalmic ointment        06/20/21 1134              Arby Barrette, MD 06/20/21 1137
